# Patient Record
Sex: Female | Born: 1995 | Race: White | Hispanic: No | Marital: Married | Smoking: Current every day smoker
Health system: Southern US, Community
[De-identification: ages and names within clinical notes are randomized; demographics above are authoritative.]

## PROBLEM LIST (undated history)

## (undated) DIAGNOSIS — F1910 Other psychoactive substance abuse, uncomplicated: Secondary | ICD-10-CM

## (undated) HISTORY — PX: WISDOM TOOTH EXTRACTION: SHX21

---
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
## 2016-04-25 ENCOUNTER — Emergency Department (HOSPITAL_COMMUNITY): Payer: Self-pay

## 2016-04-25 ENCOUNTER — Encounter (HOSPITAL_COMMUNITY): Payer: Self-pay | Admitting: Emergency Medicine

## 2016-04-25 ENCOUNTER — Inpatient Hospital Stay (HOSPITAL_COMMUNITY): Payer: Self-pay

## 2016-04-25 ENCOUNTER — Inpatient Hospital Stay (HOSPITAL_COMMUNITY)
Admission: EM | Admit: 2016-04-25 | Discharge: 2016-04-30 | DRG: 917 | Disposition: A | Discharge status: Home / Self Care | Payer: Medicaid Other | Attending: Internal Medicine | Admitting: Internal Medicine

## 2016-04-25 DIAGNOSIS — J9601 Acute respiratory failure with hypoxia: Secondary | ICD-10-CM | POA: Diagnosis not present

## 2016-04-25 DIAGNOSIS — G92 Toxic encephalopathy: Secondary | ICD-10-CM | POA: Diagnosis present

## 2016-04-25 DIAGNOSIS — R739 Hyperglycemia, unspecified: Secondary | ICD-10-CM | POA: Diagnosis present

## 2016-04-25 DIAGNOSIS — Z4659 Encounter for fitting and adjustment of other gastrointestinal appliance and device: Secondary | ICD-10-CM

## 2016-04-25 DIAGNOSIS — N179 Acute kidney failure, unspecified: Secondary | ICD-10-CM | POA: Diagnosis present

## 2016-04-25 DIAGNOSIS — F1290 Cannabis use, unspecified, uncomplicated: Secondary | ICD-10-CM | POA: Diagnosis present

## 2016-04-25 DIAGNOSIS — Z452 Encounter for adjustment and management of vascular access device: Secondary | ICD-10-CM

## 2016-04-25 DIAGNOSIS — G4700 Insomnia, unspecified: Secondary | ICD-10-CM | POA: Diagnosis present

## 2016-04-25 DIAGNOSIS — F419 Anxiety disorder, unspecified: Secondary | ICD-10-CM | POA: Diagnosis present

## 2016-04-25 DIAGNOSIS — J810 Acute pulmonary edema: Secondary | ICD-10-CM

## 2016-04-25 DIAGNOSIS — T401X1A Poisoning by heroin, accidental (unintentional), initial encounter: Secondary | ICD-10-CM | POA: Diagnosis not present

## 2016-04-25 DIAGNOSIS — J9690 Respiratory failure, unspecified, unspecified whether with hypoxia or hypercapnia: Secondary | ICD-10-CM

## 2016-04-25 DIAGNOSIS — Z888 Allergy status to other drugs, medicaments and biological substances status: Secondary | ICD-10-CM

## 2016-04-25 DIAGNOSIS — A419 Sepsis, unspecified organism: Secondary | ICD-10-CM | POA: Diagnosis present

## 2016-04-25 DIAGNOSIS — J189 Pneumonia, unspecified organism: Secondary | ICD-10-CM

## 2016-04-25 DIAGNOSIS — J9600 Acute respiratory failure, unspecified whether with hypoxia or hypercapnia: Secondary | ICD-10-CM | POA: Diagnosis present

## 2016-04-25 DIAGNOSIS — F17210 Nicotine dependence, cigarettes, uncomplicated: Secondary | ICD-10-CM | POA: Diagnosis present

## 2016-04-25 DIAGNOSIS — B951 Streptococcus, group B, as the cause of diseases classified elsewhere: Secondary | ICD-10-CM | POA: Diagnosis present

## 2016-04-25 DIAGNOSIS — N390 Urinary tract infection, site not specified: Secondary | ICD-10-CM | POA: Diagnosis present

## 2016-04-25 DIAGNOSIS — R6521 Severe sepsis with septic shock: Secondary | ICD-10-CM | POA: Diagnosis present

## 2016-04-25 DIAGNOSIS — T68XXXA Hypothermia, initial encounter: Secondary | ICD-10-CM | POA: Diagnosis present

## 2016-04-25 DIAGNOSIS — J690 Pneumonitis due to inhalation of food and vomit: Secondary | ICD-10-CM | POA: Diagnosis present

## 2016-04-25 DIAGNOSIS — E876 Hypokalemia: Secondary | ICD-10-CM | POA: Diagnosis not present

## 2016-04-25 DIAGNOSIS — E872 Acidosis: Secondary | ICD-10-CM | POA: Diagnosis present

## 2016-04-25 HISTORY — DX: Other psychoactive substance abuse, uncomplicated: F19.10

## 2016-04-25 LAB — GLUCOSE, CAPILLARY
Glucose-Capillary: 39 mg/dL — CL (ref 65–99)
Glucose-Capillary: 107 mg/dL — ABNORMAL HIGH (ref 65–99)

## 2016-04-25 LAB — COMPREHENSIVE METABOLIC PANEL
ALBUMIN: 4.5 g/dL (ref 3.5–5.0)
ALBUMIN: 3.4 g/dL — AB (ref 3.5–5.0)
ALK PHOS: 127 U/L — AB (ref 38–126)
ALK PHOS: 68 U/L (ref 38–126)
ALT: 17 U/L (ref 14–54)
ALT: 17 U/L (ref 14–54)
AST: 52 U/L — ABNORMAL HIGH (ref 15–41)
AST: 55 U/L — AB (ref 15–41)
Anion gap: 20 — ABNORMAL HIGH (ref 5–15)
Anion gap: 4 — ABNORMAL LOW (ref 5–15)
BILIRUBIN TOTAL: 1 mg/dL (ref 0.3–1.2)
BUN: 17 mg/dL (ref 6–20)
BUN: 21 mg/dL — AB (ref 6–20)
CALCIUM: 9 mg/dL (ref 8.9–10.3)
CALCIUM: 7.5 mg/dL — AB (ref 8.9–10.3)
CHLORIDE: 104 mmol/L (ref 101–111)
CO2: 16 mmol/L — ABNORMAL LOW (ref 22–32)
CO2: 24 mmol/L (ref 22–32)
CREATININE: 1.14 mg/dL — AB (ref 0.44–1.00)
Chloride: 113 mmol/L — ABNORMAL HIGH (ref 101–111)
Creatinine, Ser: 0.74 mg/dL (ref 0.44–1.00)
GFR calc Af Amer: >60 mL/min (ref >60)
GFR calc Af Amer: >60 mL/min (ref >60)
GFR calc non Af Amer: >60 mL/min (ref >60)
GLUCOSE: 47 mg/dL — AB (ref 65–99)
Glucose, Bld: 223 mg/dL — ABNORMAL HIGH (ref 65–99)
POTASSIUM: 3.3 mmol/L — AB (ref 3.5–5.1)
Potassium: 5.1 mmol/L (ref 3.5–5.1)
SODIUM: 140 mmol/L (ref 135–145)
Sodium: 141 mmol/L (ref 135–145)
TOTAL PROTEIN: 7.6 g/dL (ref 6.5–8.1)
Total Bilirubin: 0.7 mg/dL (ref 0.3–1.2)
Total Protein: 5.5 g/dL — ABNORMAL LOW (ref 6.5–8.1)

## 2016-04-25 LAB — RAPID URINE DRUG SCREEN, HOSP PERFORMED
Amphetamines: NOT DETECTED
BARBITURATES: NOT DETECTED
Benzodiazepines: POSITIVE — AB
Cocaine: POSITIVE — AB
Opiates: NOT DETECTED
Tetrahydrocannabinol: POSITIVE — AB

## 2016-04-25 LAB — CBC WITH DIFFERENTIAL/PLATELET
BASOS PCT: 0 %
Basophils Absolute: 0 10*3/uL (ref 0.0–0.1)
EOS ABS: 0 10*3/uL (ref 0.0–0.7)
EOS PCT: 0 %
HCT: 45.3 % (ref 36.0–46.0)
Hemoglobin: 15 g/dL (ref 12.0–15.0)
Lymphocytes Relative: 18 %
Lymphs Abs: 3.6 10*3/uL (ref 0.7–4.0)
MCH: 31.6 pg (ref 26.0–34.0)
MCHC: 33.1 g/dL (ref 30.0–36.0)
MCV: 95.6 fL (ref 78.0–100.0)
Monocytes Absolute: 0.1 10*3/uL (ref 0.1–1.0)
Monocytes Relative: 1 %
Neutro Abs: 16 10*3/uL — ABNORMAL HIGH (ref 1.7–7.7)
Neutrophils Relative %: 81 %
PLATELETS: 343 10*3/uL (ref 150–400)
RBC: 4.74 MIL/uL (ref 3.87–5.11)
RDW: 12.6 % (ref 11.5–15.5)
WBC: 19.9 10*3/uL — AB (ref 4.0–10.5)

## 2016-04-25 LAB — TYPE AND SCREEN
ABO/RH(D): O POS
Antibody Screen: NEGATIVE

## 2016-04-25 LAB — URINALYSIS, ROUTINE W REFLEX MICROSCOPIC
BILIRUBIN URINE: NEGATIVE
Glucose, UA: >=500 mg/dL — AB
Ketones, ur: 5 mg/dL — AB
Leukocytes, UA: NEGATIVE
Nitrite: NEGATIVE
PH: 5 (ref 5.0–8.0)
Protein, ur: 100 mg/dL — AB
SPECIFIC GRAVITY, URINE: 1.013 (ref 1.005–1.030)

## 2016-04-25 LAB — CG4 I-STAT (LACTIC ACID): Lactic Acid, Venous: 2.78 mmol/L (ref 0.5–1.9)

## 2016-04-25 LAB — I-STAT CG4 LACTIC ACID, ED: Lactic Acid, Venous: 15.7 mmol/L (ref 0.5–1.9)

## 2016-04-25 LAB — LACTIC ACID, PLASMA
Lactic Acid, Venous: 2 mmol/L (ref 0.5–1.9)
Lactic Acid, Venous: 2.8 mmol/L (ref 0.5–1.9)

## 2016-04-25 LAB — PHOSPHORUS: Phosphorus: 2.9 mg/dL (ref 2.5–4.6)

## 2016-04-25 LAB — ABO/RH: ABO/RH(D): O POS

## 2016-04-25 LAB — MRSA PCR SCREENING: MRSA by PCR: NEGATIVE

## 2016-04-25 LAB — PROTIME-INR
INR: 1.35
PROTHROMBIN TIME: 16.8 s — AB (ref 11.4–15.2)

## 2016-04-25 LAB — ETHANOL

## 2016-04-25 LAB — APTT: APTT: 25 s (ref 24–36)

## 2016-04-25 LAB — CORTISOL: Cortisol, Plasma: 46.1 ug/dL

## 2016-04-25 LAB — TROPONIN I: TROPONIN I: 0.03 ng/mL — AB (ref <0.03)

## 2016-04-25 LAB — PREGNANCY, URINE: Preg Test, Ur: NEGATIVE

## 2016-04-25 LAB — MAGNESIUM: MAGNESIUM: 1.7 mg/dL (ref 1.7–2.4)

## 2016-04-25 LAB — PROCALCITONIN: Procalcitonin: 4.35 ng/mL

## 2016-04-25 MED ORDER — PIPERACILLIN-TAZOBACTAM 3.375 G IVPB 30 MIN
3.3750 g | Freq: Once | INTRAVENOUS | Status: AC
  Administered 2016-04-25: 3.375 g via INTRAVENOUS
  Filled 2016-04-25: qty 50

## 2016-04-25 MED ORDER — LORAZEPAM 2 MG/ML IJ SOLN
1.0000 mg | Freq: Once | INTRAMUSCULAR | Status: AC
  Administered 2016-04-25: 1 mg via INTRAMUSCULAR
  Filled 2016-04-25: qty 1

## 2016-04-25 MED ORDER — POTASSIUM CHLORIDE 10 MEQ/100ML IV SOLN
10.0000 meq | INTRAVENOUS | Status: AC
  Administered 2016-04-25 (×3): 10 meq via INTRAVENOUS
  Filled 2016-04-25 (×3): qty 100

## 2016-04-25 MED ORDER — FAMOTIDINE IN NACL 20-0.9 MG/50ML-% IV SOLN
20.0000 mg | INTRAVENOUS | Status: DC
Start: 2016-04-25 — End: 2016-04-26
  Administered 2016-04-25: 20 mg via INTRAVENOUS
  Filled 2016-04-25: qty 50

## 2016-04-25 MED ORDER — DEXTROSE 50 % IV SOLN
INTRAVENOUS | Status: AC
  Administered 2016-04-25: 25 mL
  Filled 2016-04-25: qty 50

## 2016-04-25 MED ORDER — SODIUM CHLORIDE 0.9 % IV SOLN
250.0000 mL | INTRAVENOUS | Status: DC | PRN
  Administered 2016-04-25: 20 mL via INTRAVENOUS

## 2016-04-25 MED ORDER — MIDAZOLAM HCL 2 MG/2ML IJ SOLN
2.0000 mg | INTRAMUSCULAR | Status: DC | PRN
  Administered 2016-04-25: 2 mg via INTRAVENOUS
  Filled 2016-04-25: qty 2

## 2016-04-25 MED ORDER — ROCURONIUM BROMIDE 50 MG/5ML IV SOLN
70.0000 mg | Freq: Once | INTRAVENOUS | Status: AC
  Administered 2016-04-25: 70 mg via INTRAVENOUS

## 2016-04-25 MED ORDER — MIDAZOLAM HCL 2 MG/2ML IJ SOLN: 2.0000 mg | INTRAMUSCULAR | Status: DC | PRN

## 2016-04-25 MED ORDER — DEXTROSE 50 % IV SOLN
25.0000 mL | Freq: Once | INTRAVENOUS | Status: AC
  Administered 2016-04-25: 25 mL via INTRAVENOUS

## 2016-04-25 MED ORDER — PIPERACILLIN-TAZOBACTAM 3.375 G IVPB
3.3750 g | Freq: Three times a day (TID) | INTRAVENOUS | Status: DC
  Administered 2016-04-25 – 2016-04-30 (×14): 3.375 g via INTRAVENOUS
  Filled 2016-04-25 (×17): qty 50

## 2016-04-25 MED ORDER — VANCOMYCIN HCL IN DEXTROSE 1-5 GM/200ML-% IV SOLN
1000.0000 mg | Freq: Once | INTRAVENOUS | Status: AC
  Administered 2016-04-25: 1000 mg via INTRAVENOUS
  Filled 2016-04-25: qty 200

## 2016-04-25 MED ORDER — VANCOMYCIN HCL 500 MG IV SOLR
500.0000 mg | Freq: Three times a day (TID) | INTRAVENOUS | Status: DC
  Administered 2016-04-25 – 2016-04-27 (×5): 500 mg via INTRAVENOUS
  Filled 2016-04-25 (×6): qty 500

## 2016-04-25 MED ORDER — CHLORHEXIDINE GLUCONATE 0.12% ORAL RINSE (MEDLINE KIT)
15.0000 mL | Freq: Two times a day (BID) | OROMUCOSAL | Status: DC
  Administered 2016-04-25 – 2016-04-26 (×2): 15 mL via OROMUCOSAL

## 2016-04-25 MED ORDER — HEPARIN SODIUM (PORCINE) 5000 UNIT/ML IJ SOLN
5000.0000 [IU] | Freq: Three times a day (TID) | INTRAMUSCULAR | Status: DC
  Administered 2016-04-25 – 2016-04-27 (×6): 5000 [IU] via SUBCUTANEOUS
  Filled 2016-04-25 (×6): qty 1

## 2016-04-25 MED ORDER — PIPERACILLIN-TAZOBACTAM 3.375 G IVPB 30 MIN: 3.3750 g | Freq: Once | INTRAVENOUS | Status: DC

## 2016-04-25 MED ORDER — FENTANYL BOLUS VIA INFUSION
50.0000 ug | INTRAVENOUS | Status: DC | PRN
Start: 2016-04-25 — End: 2016-04-26
  Administered 2016-04-25 – 2016-04-26 (×2): 50 ug via INTRAVENOUS
  Filled 2016-04-25: qty 50

## 2016-04-25 MED ORDER — MIDAZOLAM HCL 2 MG/2ML IJ SOLN
INTRAMUSCULAR | Status: AC
  Administered 2016-04-25: 2 mg
  Filled 2016-04-25: qty 2

## 2016-04-25 MED ORDER — SODIUM CHLORIDE 0.9 % IV SOLN
25.0000 ug/h | INTRAVENOUS | Status: DC
  Administered 2016-04-25: 50 ug/h via INTRAVENOUS
  Administered 2016-04-26: 300 ug/h via INTRAVENOUS
  Filled 2016-04-25 (×2): qty 50

## 2016-04-25 MED ORDER — VANCOMYCIN HCL IN DEXTROSE 1-5 GM/200ML-% IV SOLN: 1000.0000 mg | Freq: Once | INTRAVENOUS | Status: DC

## 2016-04-25 MED ORDER — FENTANYL CITRATE (PF) 100 MCG/2ML IJ SOLN
100.0000 ug | INTRAMUSCULAR | Status: DC | PRN
  Administered 2016-04-25: 100 ug via INTRAVENOUS
  Filled 2016-04-25: qty 2

## 2016-04-25 MED ORDER — FUROSEMIDE 10 MG/ML IJ SOLN
20.0000 mg | Freq: Two times a day (BID) | INTRAMUSCULAR | Status: DC
  Filled 2016-04-25: qty 2

## 2016-04-25 MED ORDER — FUROSEMIDE 10 MG/ML IJ SOLN
INTRAMUSCULAR | Status: AC
  Filled 2016-04-25: qty 4

## 2016-04-25 MED ORDER — VANCOMYCIN HCL IN DEXTROSE 750-5 MG/150ML-% IV SOLN: 750.0000 mg | Freq: Two times a day (BID) | INTRAVENOUS | Status: DC

## 2016-04-25 MED ORDER — MIDAZOLAM HCL 2 MG/2ML IJ SOLN
INTRAMUSCULAR | Status: AC
  Filled 2016-04-25: qty 2

## 2016-04-25 MED ORDER — FENTANYL CITRATE (PF) 100 MCG/2ML IJ SOLN: 100.0000 ug | INTRAMUSCULAR | Status: DC | PRN

## 2016-04-25 MED ORDER — SODIUM CHLORIDE 0.9 % IV BOLUS (SEPSIS)
1000.0000 mL | Freq: Once | INTRAVENOUS | Status: AC
  Administered 2016-04-25: 1000 mL via INTRAVENOUS

## 2016-04-25 MED ORDER — FENTANYL CITRATE (PF) 100 MCG/2ML IJ SOLN
INTRAMUSCULAR | Status: AC
  Administered 2016-04-25: 50 ug
  Filled 2016-04-25: qty 2

## 2016-04-25 MED ORDER — NALOXONE HCL 0.4 MG/ML IJ SOLN: 0.4000 mg | INTRAMUSCULAR | Status: DC | PRN

## 2016-04-25 MED ORDER — SODIUM CHLORIDE 0.9 % IV SOLN: 30.0000 meq | Freq: Once | INTRAVENOUS | Status: DC

## 2016-04-25 MED ORDER — NALOXONE HCL 0.4 MG/ML IJ SOLN
INTRAMUSCULAR | Status: AC
  Administered 2016-04-25: 0.4 mg
  Filled 2016-04-25: qty 1

## 2016-04-25 MED ORDER — ETOMIDATE 2 MG/ML IV SOLN
20.0000 mg | Freq: Once | INTRAVENOUS | Status: AC
  Administered 2016-04-25: 20 mg via INTRAVENOUS

## 2016-04-25 MED ORDER — PHENYLEPHRINE HCL 10 MG/ML IJ SOLN
0.0000 ug/min | INTRAMUSCULAR | Status: DC
  Administered 2016-04-25: 45 ug/min via INTRAVENOUS
  Administered 2016-04-25: 30 ug/min via INTRAVENOUS
  Administered 2016-04-26: 110 ug/min via INTRAVENOUS
  Administered 2016-04-26: 100 ug/min via INTRAVENOUS
  Filled 2016-04-25 (×6): qty 1

## 2016-04-25 MED ORDER — FENTANYL CITRATE (PF) 100 MCG/2ML IJ SOLN: 50.0000 ug | Freq: Once | INTRAMUSCULAR | Status: DC

## 2016-04-25 MED ORDER — ORAL CARE MOUTH RINSE
15.0000 mL | Freq: Four times a day (QID) | OROMUCOSAL | Status: DC
  Administered 2016-04-26 (×3): 15 mL via OROMUCOSAL

## 2016-04-25 MED ORDER — MIDAZOLAM BOLUS VIA INFUSION
1.0000 mg | INTRAVENOUS | Status: DC | PRN
  Administered 2016-04-26: 2 mg via INTRAVENOUS
  Filled 2016-04-25: qty 2

## 2016-04-25 MED ORDER — FUROSEMIDE 10 MG/ML IJ SOLN
10.0000 mg | Freq: Two times a day (BID) | INTRAMUSCULAR | Status: DC
  Administered 2016-04-25: 10 mg via INTRAVENOUS

## 2016-04-25 MED ORDER — SODIUM CHLORIDE 0.9 % IV SOLN
0.0000 mg/h | INTRAVENOUS | Status: DC
  Administered 2016-04-25: 2 mg/h via INTRAVENOUS
  Administered 2016-04-26: 5 mg/h via INTRAVENOUS
  Filled 2016-04-25 (×2): qty 10

## 2016-04-25 MED ORDER — DEXTROSE-NACL 5-0.9 % IV SOLN
INTRAVENOUS | Status: DC
  Administered 2016-04-25: 20:00:00 via INTRAVENOUS

## 2016-04-25 MED ORDER — LACTATED RINGERS IV SOLN: INTRAVENOUS | Status: DC

## 2016-04-25 NOTE — ED Notes (Signed)
Sitter at bedside.

## 2016-04-25 NOTE — ED Triage Notes (Signed)
Per EMS, patient found on the ground outside of a halfway house, given  Narcan IN by a bystander after she was found snorting heroin. Patient screaming and crying.

## 2016-04-25 NOTE — Progress Notes (Signed)
eLink Physician-Brief Progress Note Patient Name: Suzanne Hill DOB: 1995-10-16 MRN: [id number]   Date of Service  04/25/2016  HPI/Events of Note  Hypoxia Awake Alert pcxr edema vs asp heroic OD  eICU Interventions  Lasix kvo abg Adding 6 liters under 100% May need NIMV narcan pccm doc now at bedside     Intervention Category Major Interventions: Airway management  Nelda Bucks. 04/25/2016, 4:20 PM

## 2016-04-25 NOTE — ED Notes (Signed)
Critical care at bedside  

## 2016-04-25 NOTE — H&P (Signed)
PULMONARY / CRITICAL CARE MEDICINE   Name: Birdena Kingma MRN: [id number] DOB: 06-15-1995    ADMISSION DATE:  04/25/2016 CONSULTATION DATE:  4/10  REFERRING MD:  Effie Shy   CHIEF COMPLAINT:  Overdose, aspiration and sepsis   HISTORY OF PRESENT ILLNESS:   This is a 21 year old female w/out sig medical history. Uses marijuana for self medication for insomnia and anxiety. Lives w/ parents. Smokes cigarettes. Works part time as a Designer, fashion/clothing person.  Was found unresponsive outside a half way house on 4/10. Was administered intra-nasal narcan bye bystander & EMS called. On arrival to ER was combative and agitated. Hypoxic w/ sats in 80s on Non-rebreather mask w/ lactic acid 15.7 and SBP in 80s-90s. CXR w/ bilateral airspace disease. 100ml/kg bolus was initiated.  ABX started. Bye time of PCCM arrival pt was awake. Alert and able to recall that she vomited shortly after snorting a line of Heroin and then she "must have passed out".  Her- Repeat Assessment Performed at:1544 Vitals Blood pressure (!) 96/58, pulse (!) 127, temperature (!) 92.7 F (33.7 C), temperature source Rectal, resp. rate 20, weight 120 lb (54.4 kg), SpO2 (!) 89 %. Heart: TachycardicLungs:RalesCapillary Refill:<2 secPeripheral Pulse:Radial pulse palpable Skin:Flushed  She will be admitted to the ICU for further care of aspiration PNA/Acute Hypoxic resp failure and septic shock/   PAST MEDICAL HISTORY :  She  has a past medical history of Substance abuse.  PAST SURGICAL HISTORY: She  has a past surgical history that includes Wisdom tooth extraction.  Allergies  Allergen Reactions  . Benadryl [Diphenhydramine Hcl] Hives    No current facility-administered medications on file prior to encounter.    No current outpatient prescriptions on file prior to encounter.    FAMILY HISTORY:  Her has no family status information on file.    SOCIAL HISTORY: She  reports that she has been smoking.  She has never used smokeless  tobacco. She reports that she drinks alcohol. She reports that she uses drugs.  Review of Systems:   Bolds are positive  Constitutional: weight loss, gain, night sweats, Fevers, chills, fatigue .  HEENT: headaches, Sore throat, sneezing, nasal congestion, post nasal drip, Difficulty swallowing, Tooth/dental problems, visual complaints visual changes, ear ache, lips dry CV:  chest pain, radiates:,Orthopnea, PND, swelling in lower extremitie, dizziness, palpitations, syncope.  GI  heartburn, indigestion, abdominal pain, nausea, vomiting, diarrhea, change in bowel habits, loss of appetite, bloody stools.  Resp:  cough, not productive: , hemoptysis, dyspnea at rest, chest pain, pleuritic.  Skin: rash or itching or icterus GU: dysuria, change in color of urine, urgency or frequency. flank pain, hematuria  MS: joint pain or swelling. decreased range of motion  Psych: change in mood or affect. depression or anxiety.  Neuro: difficulty with speech, weakness, numbness, ataxia   SUBJECTIVE:  Thirsty   VITAL SIGNS: BP (!) 96/58   Pulse (!) 127   Temp (!) 92.7 F (33.7 C) (Rectal)   Resp (!) 21   Wt 120 lb (54.4 kg)   SpO2 (!) 89%   HEMODYNAMICS:    VENTILATOR SETTINGS:    INTAKE / OUTPUT: No intake/output data recorded.  PHYSICAL EXAMINATION: General:  21 year old female awake, alert. No focal def. No acute distress but is short of breath w/ mask off  Neuro:  Awake, oriented no focal def no motor def  HEENT:  [place]. Has small abrasion on tip of forehead  Cardiovascular:  Tachy rrr w/out MRG  Lungs:  Diffuse  rales. No accessory use  Abdomen:  abd soft. Not tender  Musculoskeletal:  Equal st and bulk  Skin:  Warm and dry   LABS:  BMET  Recent Labs Lab 04/25/16 1247  NA 140  K 5.1  CL 104  CO2 16*  BUN 17  CREATININE 1.14*  GLUCOSE 223*    Electrolytes  Recent Labs Lab 04/25/16 1247  CALCIUM 9.0    CBC  Recent Labs Lab 04/25/16 1247  WBC 19.9*  HGB 15.0   HCT 45.3  PLT 343    Coag's No results for input(s): APTT, INR in the last 168 hours.  Sepsis Markers  Recent Labs Lab 04/25/16 1255  LATICACIDVEN 15.70*    ABG No results for input(s): PHART, PCO2ART, PO2ART in the last 168 hours.  Liver Enzymes  Recent Labs Lab 04/25/16 1247  AST 52*  ALT 17  ALKPHOS 127*  BILITOT 1.0  ALBUMIN 4.5    Cardiac Enzymes No results for input(s): TROPONINI, PROBNP in the last 168 hours.  Glucose No results for input(s): GLUCAP in the last 168 hours.  Imaging Dg Chest Port 1 View  Result Date: 04/25/2016 CLINICAL DATA:  Drug overdose EXAM: PORTABLE CHEST 1 VIEW COMPARISON:  PA and lateral chest x-ray of January 29, 2007 FINDINGS: There are fluffy alveolar opacities bilaterally. The pulmonary vascularity is indistinct. The cardiac silhouette is normal in size. There is no pleural effusion or pneumothorax. The mediastinum is normal in width. The bony thorax exhibits no acute abnormality. IMPRESSION: Bilateral pulmonary interstitial and alveolar edema. Bilateral pneumonia is felt less likely. Electronically Signed   By: David  [place] M.D.   On: 04/25/2016 12:42  PCXR R>L patchy pulmonary infiltrates   STUDIES:    CULTURES: UC 4/10>>> BCX2 4/10>>>  ANTIBIOTICS: vanc 4/10>>> Zosyn 4/10>>>  SIGNIFICANT EVENTS:   LINES/TUBES:     ASSESSMENT / PLAN:  Acute Hypoxic respiratory Failure in setting of aspiration PNA Plan High flow oxygen  Cont pulse ox NPO except sips Admit to ICU and monitor for need for intubation  vanc day 0/x Zosyn day 0/x Pct algo    Septic Shock due to aspiration  Plan Complete 30 ml/kg Repeat Lactic acid IVFs abx as above  If lactic acid not cleared > 10% then will need to consider central access   AKI & lactic acidosis Plan IVFs MAP goal > 65 Repeat LA Renal dose meds   Acute encephalopathy. D/t heroin overdose -->resolved currently  Plan Supportive care   Hyperglycemia  Plan Ck  A1C cbg q 4   FAMILY  - Updates:   - Inter-disciplinary family meet or Palliative Care meeting due by: 4/17  DISCUSSION: 21 year old female being admitted w/ acute hypoxic respiratory failure and severe sepsis/septic shock in setting of aspiration after heroin overdose.  -empiric abx -high flow oxygen -complete 18ml/kg bolus -repeat LA and consider more fluids vs central access vs both   My critical care time 39 minutes  Simonne Martinet ACNP-BC [facility] Pulmonary/Critical Care Pager # [phone] OR # [phone] if no answer     04/25/2016, 3:24 PM    ATTENDING NOTE / ATTESTATION NOTE :   I have discussed the case with the resident/APP  Anders Simmonds NP.   I agree with the resident/APP's  history, physical examination, assessment, and plans.    I have edited the above note and modified it according to our agreed history, physical examination, assessment and plan.   21 year old female, no known lung problems, nonsmoker, uses  marijuana to treat insomnia and anxiety, was found unresponsive outside her halfway house. She was given intranasal Narcan and woke up. EMS was called and she was brought to the emergency room. She was combative, tachypneic, hypotensive, tachycardic. Her lactic acid was elevated at 15. Sepsis secondary to aspiration pneumonia was considered. She got 2 L saline at the emergency room. Her O2 saturation was in the mid 80s on room air, improving to the low 90s and high 80s on nonrebreather mask. She was tried on high flow nasal cannula which dropped her sats further. She did not tolerate BiPAP in the emergency room. She remembers vomiting yesterday.  Upon seeing her in the ICU, her respiratory rate was in the 40s. On nonrebreather mask and 6 L nasal cannula, her O2 saturation was low 80s. We ended up intubating her. Please see separate note. Prior to intubation, she wanted me to let her mother know about what's going on. I updated her mother.   Pt had pink frothy  secretions on intubation. Blood pressure dropped  to 60s with sedation, improved with Neo-Synephrine at 50 g per kg per minute. We held further hydration.  Vitals:  Vitals:   04/25/16 1640 04/25/16 1645 04/25/16 1648 04/25/16 1700  BP: (!) 81/55 (!) 66/43 (!) 71/45   Pulse: (!) 130 (!) 160 (!) 154   Resp: (!) 31 20 (!) 24   Temp:      TempSrc:      SpO2: (!) 81% 97% 100% 100%  Weight:      Height:        Constitutional/General: well-nourished, well-developed, intubated, sedated, not in any distress  Body mass index is 19.54 kg/m. Wt Readings from Last 3 Encounters:  04/25/16 54.9 kg (121 lb 0.5 oz)  05/07/13 52.2 kg (115 lb) (30 %, Z= -0.54)*   * Growth percentiles are based on CDC 2-20 Years data.    HEENT: PERLA, anicteric sclerae. (-) Oral thrush. Intubated, ETT in place  Neck: No masses. Midline trachea. No JVD, (-) LAD. (-) bruits appreciated.  Respiratory/Chest: Grossly normal chest. (-) deformity. (-) Accessory muscle use.  Symmetric expansion. Diminished BS on both lower lung zones. (-) wheezing, Crackles bilaterally (-) egophony  Cardiovascular: Regular rate and  rhythm, heart sounds normal, no murmur or gallops,  (-)  peripheral edema  Gastrointestinal:  Normal bowel sounds. Soft, non-tender. No hepatosplenomegaly.  (-) masses.   Musculoskeletal:  Normal muscle tone.   Extremities: Grossly normal. (-) clubbing, cyanosis.  (-) edema  Skin: (-) rash,lesions seen.   Neurological/Psychiatric : sedated, intubated. CN grossly intact. (-) lateralizing signs.     CBC Recent Labs     04/25/16  1247  WBC  19.9*  HGB  15.0  HCT  45.3  PLT  343    Coag's No results for input(s): APTT, INR in the last 72 hours.  BMET Recent Labs     04/25/16  1247  NA  140  K  5.1  CL  104  CO2  16*  BUN  17  CREATININE  1.14*  GLUCOSE  223*    Electrolytes Recent Labs     04/25/16  1247  CALCIUM  9.0    Sepsis Markers No results for input(s):  PROCALCITON, O2SATVEN in the last 72 hours.  Invalid input(s): LACTICACIDVEN  ABG No results for input(s): PHART, PCO2ART, PO2ART in the last 72 hours.  Liver Enzymes Recent Labs     04/25/16  1247  AST  52*  ALT  17  ALKPHOS  127*  BILITOT  1.0  ALBUMIN  4.5    Cardiac Enzymes No results for input(s): TROPONINI, PROBNP in the last 72 hours.  Glucose No results for input(s): GLUCAP in the last 72 hours.  Imaging Dg Chest Port 1 View  Result Date: 04/25/2016 CLINICAL DATA:  Drug overdose EXAM: PORTABLE CHEST 1 VIEW COMPARISON:  PA and lateral chest x-ray of January 29, 2007 FINDINGS: There are fluffy alveolar opacities bilaterally. The pulmonary vascularity is indistinct. The cardiac silhouette is normal in size. There is no pleural effusion or pneumothorax. The mediastinum is normal in width. The bony thorax exhibits no acute abnormality. IMPRESSION: Bilateral pulmonary interstitial and alveolar edema. Bilateral pneumonia is felt less likely. Electronically Signed   By: David  [place] M.D.   On: 04/25/2016 12:42   Assessment/Plan : Acute hypoxemic respiratory failure secondary to diffuse bilateral infiltrates. By history, consistent with aspiration pneumonia with passing out after using marijuana and other drugs. On intubation, she had pink frothy secretions. She had been resuscitated with 2 L saline at the ED. I cannot necessarily say that this is purely ARDS as it might be more of pulmonary edema at this point. - We'll try lung protective strategy. - She is on 100% FiO2, PEEP 12, 400 mL tidal volume, respiratory rate 24. Her O2 saturation is 100%. Plateau is 24 and these settings. Check ABG in an hour. - We will hold off on further hydration. - She will need further diuresis, blood pressure allowing. - Continue Neo-Synephrine to keep map more than 65 mmHg. - Needs sedation. Versed and fentanyl pushes. - Continue broad-spectrum antibiotics with vancomycin and Zosyn. -  Panculture. - EKGs nonspecific but possible ischemia but she was tachycardic. We will trend troponin, check 2-D echo. - Trend lactic acid. - keep NPO for now  Polysubstance abuse - check UDS, ethanol, pregnancy test - versed and fentanyl pushes.   AKI - S/P 2L IVF.  Now with pulm edema. We'll hold off on further hydration. Continue diuresis.  Best practice : Heparin sq for DVT prophylaxis. PPI for SUP   I spent  30  minutes of Critical Care time with this patient today. This is my time spent independent of the APP or resident.   Family :Family updated at length today.  I called up her mother Tresa Endo [phone]. I updated her of her daughter's condition. She seemed estranged but the patient identified her as the next of kin. The patient wanted to keep all her information only to be discussed with her mother.   Pollie Meyer, MD 04/25/2016, 5:15 PM [place] [facility] Pager [phone] After 3 pm or if no answer, call [phone]

## 2016-04-25 NOTE — ED Notes (Signed)
Respiratory at bedside.

## 2016-04-25 NOTE — Progress Notes (Signed)
CRITICAL VALUE ALERT  Critical value received:  Lactic Acid 2.0  Date of notification: 04/25/2016  Time of notification: 1732  Critical value read back: Yes  Nurse who received alert: Hazel Sams RN   MD notified (1st page):  MD Tyson Alias   Time of first page: 1734  MD notified (2nd page):  Time of second page:  Responding MD: MD Tyson Alias   Time MD responded:  [phone]

## 2016-04-25 NOTE — ED Provider Notes (Signed)
[facility]-EMERGENCY DEPT Provider Note   CSN: [id number] Arrival date & time: 04/25/16  1127     History   Chief Complaint Chief Complaint  Patient presents with  . Drug Overdose    HPI Suzanne Hill is a 21 y.o. female.  The patient presents with suspected narcotic overdose, after receiving bystander Narcan intranasal.  She was combative, and crying, during transport.  She did not receive additional treatments, and EMS.  She is unable to give any history.  Level 5 caveat-altered mental status  HPI  Past Medical History:  Diagnosis Date  . Substance abuse     Patient Active Problem List   Diagnosis Date Noted  . Acute respiratory failure (HCC) 04/25/2016  . Chlamydia 05/08/2013  . Abdominal pain 05/08/2013    Past Surgical History:  Procedure Laterality Date  . WISDOM TOOTH EXTRACTION      OB History    No data available       Home Medications    Prior to Admission medications   Not on File    Family History History reviewed. No pertinent family history.  Social History Social History  Substance Use Topics  . Smoking status: Current Every Day Smoker  . Smokeless tobacco: Never Used  . Alcohol use Yes     Allergies   Benadryl [diphenhydramine hcl]   Review of Systems Review of Systems  Unable to perform ROS: Mental status change     Physical Exam Updated Vital Signs BP 99/63   Pulse (!) 135   Temp (!) 92.7 F (33.7 C) (Rectal)   Resp (!) 31   Wt 120 lb (54.4 kg)   SpO2 (!) 87%   Physical Exam  Constitutional: She appears well-developed.  Unkempt  HENT:  Head: Normocephalic.  Blood left forehead and right nares, unclear source.  No contusion or crepitation of the face.  Dry oral mucous membranes  Eyes: Conjunctivae and EOM are normal. Pupils are equal, round, and reactive to light.  Neck: Normal range of motion and phonation normal. Neck supple.  Cardiovascular: Regular rhythm.   Tachycardic  Pulmonary/Chest: Effort normal and  breath sounds normal. She exhibits no tenderness.  Abdominal: Soft. She exhibits no distension. There is no tenderness. There is no guarding.  Musculoskeletal: Normal range of motion.  Neurological: She is alert. She exhibits normal muscle tone.  Alert, minimally cooperative with exam, continually crying out, secondary to discomfort.  Skin: Skin is warm and dry.  Psychiatric:  Agitated, somewhat combative.  Nursing note and vitals reviewed.    ED Treatments / Results  Labs (all labs ordered are listed, but only abnormal results are displayed) Labs Reviewed  COMPREHENSIVE METABOLIC PANEL - Abnormal; Notable for the following:       Result Value   CO2 16 (*)    Glucose, Bld 223 (*)    Creatinine, Ser 1.14 (*)    AST 52 (*)    Alkaline Phosphatase 127 (*)    Anion gap 20 (*)    All other components within normal limits  CBC WITH DIFFERENTIAL/PLATELET - Abnormal; Notable for the following:    WBC 19.9 (*)    Neutro Abs 16.0 (*)    All other components within normal limits  URINALYSIS, ROUTINE W REFLEX MICROSCOPIC - Abnormal; Notable for the following:    APPearance HAZY (*)    Glucose, UA >=500 (*)    Hgb urine dipstick SMALL (*)    Ketones, ur 5 (*)    Protein, ur 100 (*)  Bacteria, UA RARE (*)    Squamous Epithelial / LPF 0-5 (*)    All other components within normal limits  I-STAT CG4 LACTIC ACID, ED - Abnormal; Notable for the following:    Lactic Acid, Venous 15.70 (*)    All other components within normal limits  CULTURE, BLOOD (ROUTINE X 2)  CULTURE, BLOOD (ROUTINE X 2)  URINE CULTURE  COMPREHENSIVE METABOLIC PANEL  LACTIC ACID, PLASMA  LACTIC ACID, PLASMA  CORTISOL  PROTIME-INR  PROCALCITONIN  APTT  HIV ANTIBODY (ROUTINE TESTING)  I-STAT CG4 LACTIC ACID, ED  TYPE AND SCREEN    EKG  EKG Interpretation  Date/Time:  Tuesday April 25 2016 12:27:22 EDT Ventricular Rate:  124 PR Interval:    QRS Duration: 90 QT Interval:  312 QTC Calculation: 449 R  Axis:   80 Text Interpretation:  indeterminate rhythym Repol abnrm suggests ischemia, diffuse leads Artifact in lead(s) I II III aVR aVL aVF V1 V2 V3 V4 V5 V6 No previous ECGs available Confirmed by Effie Shy  MD, Baer Hinton [phone]) on 04/25/2016 1:46:17 PM       Radiology Dg Chest Port 1 View  Result Date: 04/25/2016 CLINICAL DATA:  Drug overdose EXAM: PORTABLE CHEST 1 VIEW COMPARISON:  PA and lateral chest x-ray of January 29, 2007 FINDINGS: There are fluffy alveolar opacities bilaterally. The pulmonary vascularity is indistinct. The cardiac silhouette is normal in size. There is no pleural effusion or pneumothorax. The mediastinum is normal in width. The bony thorax exhibits no acute abnormality. IMPRESSION: Bilateral pulmonary interstitial and alveolar edema. Bilateral pneumonia is felt less likely. Electronically Signed   By: David  [place] M.D.   On: 04/25/2016 12:42    Procedures Procedures (including critical care time)  Medications Ordered in ED Medications  piperacillin-tazobactam (ZOSYN) IVPB 3.375 g (not administered)  vancomycin (VANCOCIN) IVPB 1000 mg/200 mL premix (not administered)  lactated ringers infusion (not administered)  heparin injection 5,000 Units (not administered)  0.9 %  sodium chloride infusion (not administered)  LORazepam (ATIVAN) injection 1 mg (1 mg Intramuscular Given 04/25/16 1215)  sodium chloride 0.9 % bolus 1,000 mL (1,000 mLs Intravenous New Bag/Given 04/25/16 1256)    And  sodium chloride 0.9 % bolus 1,000 mL (1,000 mLs Intravenous New Bag/Given 04/25/16 1256)  piperacillin-tazobactam (ZOSYN) IVPB 3.375 g (0 g Intravenous Stopped 04/25/16 1433)  vancomycin (VANCOCIN) IVPB 1000 mg/200 mL premix (0 mg Intravenous Stopped 04/25/16 1433)     Initial Impression / Assessment and Plan / ED Course  I have reviewed the triage vital signs and the nursing notes.  Pertinent labs & imaging results that were available during my care of the patient were reviewed by me  and considered in my medical decision making (see chart for details).  Clinical Course as of Apr 25 1516  Tue Apr 25, 2016  1310 At this time, on BiPAP, oxygen saturation is normal 99%.  Patient is alert conversant and request fluid to drink.  [EW]  1324 Oxygen stabilization maneuvers: Nasal cannula without improvement, placed on BiPAP, with significant improvement, in respiratory status.  [EW]  1324 Blood pressure assessment, initially hypertensive, repeat blood pressure at 1302 74/52.  Pressure repeated by me, now, is 121/73.  The prior blood pressure appears to be erroneous, it was noted that the blood pressure cuff was on the bed, before I took the blood pressure.  [EW]  1334 Chest x-ray indicates pulmonary edema, patient currently on facemask oxygen at 15 L, her request after pulling off the BiPAP facemask.  Oxygen saturation 90% currently.  Patient continues to [facility] well.  IV fluid boluses turned down to 125, 2 bags.  We will consult intensivist, but likely will minimize IV fluid boluses at this time.  [EW]  1517 Rectal temperature improving.  Persistent tachycardia.  [EW]    Clinical Course User Index [EW] Mancel Bale, MD    Medications  piperacillin-tazobactam (ZOSYN) IVPB 3.375 g (not administered)  vancomycin (VANCOCIN) IVPB 1000 mg/200 mL premix (not administered)  lactated ringers infusion (not administered)  heparin injection 5,000 Units (not administered)  0.9 %  sodium chloride infusion (not administered)  LORazepam (ATIVAN) injection 1 mg (1 mg Intramuscular Given 04/25/16 1215)  sodium chloride 0.9 % bolus 1,000 mL (1,000 mLs Intravenous New Bag/Given 04/25/16 1256)    And  sodium chloride 0.9 % bolus 1,000 mL (1,000 mLs Intravenous New Bag/Given 04/25/16 1256)  piperacillin-tazobactam (ZOSYN) IVPB 3.375 g (0 g Intravenous Stopped 04/25/16 1433)  vancomycin (VANCOCIN) IVPB 1000 mg/200 mL premix (0 mg Intravenous Stopped 04/25/16 1433)    Patient Vitals for the past 24  hrs:  BP Temp Temp src Pulse Resp SpO2 Weight  04/25/16 1445 99/63 - - (!) 135 (!) 31 (!) 87 % -  04/25/16 1416 111/74 (!) 92.7 F (33.7 C) Rectal (!) 135 (!) 26 92 % -  04/25/16 1400 116/74 - - (!) 136 (!) 28 91 % -  04/25/16 1339 - - - - - - 120 lb (54.4 kg)  04/25/16 1330 118/76 - - (!) 142 (!) 36 90 % -  04/25/16 1327 123/71 - - (!) 139 (!) 24 (!) 88 % -  04/25/16 1302 - - - (!) 131 (!) 25 99 % -  04/25/16 1247 (!) 131/109 - - (!) 113 (!) 44 94 % -  04/25/16 1156 (!) 131/109 (!) 85 F (29.4 C) - 84 (!) 24 (!) 84 % -   13: 35-requested called to intensivist, for assistance with admission and management.  Case discussed with intensivist, will minimize fluid boluses, and they will admit the patient.  3:17 PM Reevaluation with update and discussion. After initial assessment and treatment, an updated evaluation reveals she remains alert, and communicative, and is thirsty.Mancel Bale L    CRITICAL CARE Performed by: Flint Melter Total critical care time: 50 minutes Critical care time was exclusive of separately billable procedures and treating other patients. Critical care was necessary to treat or prevent imminent or life-threatening deterioration. Critical care was time spent personally by me on the following activities: development of treatment plan with patient and/or surrogate as well as nursing, discussions with consultants, evaluation of patient's response to treatment, examination of patient, obtaining history from patient or surrogate, ordering and performing treatments and interventions, ordering and review of laboratory studies, ordering and review of radiographic studies, pulse oximetry and re-evaluation of patient's condition.  Final Clinical Impressions(s) / ED Diagnoses   Final diagnoses:  Accidental overdose of heroin, initial encounter  Hypothermia, initial encounter  Acute pulmonary edema (HCC)    Patient with suspected opiate overdose, likely had a period of  hypoventilation, possibly with secondary acute pulmonary edema.  Patient with elevated lactate, indicating hypoperfusion, but there is no suspected infection, etiology.  Elevated white blood cell count is nonspecific.  Empiric antibiotics have been started.  Blood and urine cultures are pending.  Patient hypothermic, being treated with warming blanket.   Nursing Notes Reviewed/ Care Coordinated Applicable Imaging Reviewed Interpretation of Laboratory Data incorporated into ED treatment   Plan : Admit  New Prescriptions New Prescriptions   No medications on file     Mancel Bale, MD 04/26/16 [phone]

## 2016-04-25 NOTE — Progress Notes (Signed)
eLink Physician-Brief Progress Note Patient Name: Suzanne Hill DOB: Sep 12, 1995 MRN: [id number]   Date of Service  04/25/2016  HPI/Events of Note  pepcid  eICU Interventions       Intervention Category Intermediate Interventions: Best-practice therapies (e.g. DVT, beta blocker, etc.)  Nelda Bucks. 04/25/2016, 6:00 PM

## 2016-04-25 NOTE — ED Notes (Signed)
MD made aware of temperature and trending O2 levels.

## 2016-04-25 NOTE — ED Notes (Signed)
Bed: WA20 Expected date:  Expected time:  Means of arrival:  Comments: EMS/heroin OD 

## 2016-04-25 NOTE — Progress Notes (Signed)
eLink Physician-Brief Progress Note Patient Name: Suzanne Hill DOB: July 20, 1995 MRN: [id number]   Date of Service  04/25/2016  HPI/Events of Note  ARDS? High peep Agitation increasing  eICU Interventions  Add versed int fent drip ( heroic OD , now showing WD)     Intervention Category Major Interventions: OtherNelda Bucks. 04/25/2016, 6:45 PM

## 2016-04-25 NOTE — Procedures (Signed)
Intubation Procedure Note Suzanne Hill [id number] [birth year]  Procedure: Intubation Indications: Respiratory insufficiency   Pt with worsening resp status since arrival to ICU.  o2 sats 80% on 100% NRBM and 6L [place]. RR 40s.   Procedure Details Consent: Risks of procedure as well as the alternatives and risks of each were explained to the (patient/caregiver).  Consent for procedure obtained. Time Out: Verified patient identification, verified procedure, site/side was marked, verified correct patient position, special equipment/implants available, medications/allergies/relevent history reviewed, required imaging and test results available.  Performed  Maximum sterile technique was used including antiseptics, hand hygiene and mask.  MAC and 3  Versed 2 mg, Fentanyl 50 mg, Etomidate 20 mg, Rocuronium 70 mg >>> all IV and divided doses.  Pt had pink frothy secretions with intubation.     Evaluation Hemodynamic Status: Transient hypotension treated with pressors; O2 sats: stable throughout Patient's Current Condition: stable Complications: No apparent complications Patient did tolerate procedure well. Chest X-ray ordered to verify placement.  CXR: pending.   [place] 04/25/2016

## 2016-04-25 NOTE — Progress Notes (Signed)
eLink Physician-Brief Progress Note Patient Name: Suzanne Hill DOB: Aug 05, 1995 MRN: [id number]   Date of Service  04/25/2016  HPI/Events of Note  Asp vs edema from heroin  OD Awake alert,  On 100% NRB See my prior note for managemwent Lactic cleraring well  eICU Interventions  abg May need ett PCCm md assessing for ett need Lasix to neg balance Narcan, may need drip     Intervention Category Evaluation Type: New Patient Evaluation  Suzanne Hill. 04/25/2016, 4:38 PM

## 2016-04-25 NOTE — Progress Notes (Signed)
Notified MD Tyson Alias in regards to patient's CBG and also increase agitation. Told patient is in bilateral wrist restraints due to patient actively trying to self extubated. Shelby RN at bedside.

## 2016-04-25 NOTE — ED Notes (Signed)
ED Provider at bedside. 

## 2016-04-25 NOTE — ED Notes (Signed)
XR at bedside

## 2016-04-25 NOTE — Progress Notes (Signed)
Pharmacy Antibiotic Note  Suzanne Hill is a 21 y.o. female admitted on 04/25/2016 with heroin OD and septic shock due to aspiration. Pharmacy has been consulted for Vancomycin and Zosyn dosing.  Plan: Vancomycin 1g IV x 1 given in the ED. Continue with Vancomycin  IV q8h. Plan for Vancomycin trough level at steady state. Goal trough level 15-20 mcg/mL. Zosyn 3.375g IV x 1 over 30 minutes given in the ED. Continue with Zosyn 3.375g IV q8h (infuse over 4 hours). Monitor renal function, cultures, clinical course.  Height:  (167.6 cm) Weight: 121 lb 0.5 oz (54.9 kg) IBW/kg (Calculated) : 59.3  Temp (24hrs), Avg:92.9 F (33.8 C), Min:85 F (29.4 C), Max:97.5 F (36.4 C)   Recent Labs Lab 04/25/16 1247 04/25/16 1255 04/25/16 1554 04/25/16 1629  WBC 19.9*  --   --   --   CREATININE 1.14*  --   --  0.74  LATICACIDVEN  --  15.70* 2.78* 2.0*    Estimated Creatinine Clearance: 96.4 mL/min (by C-G formula based on SCr of 0.74 mg/dL).    Allergies  Allergen Reactions  . Benadryl [Diphenhydramine Hcl] Hives    Antimicrobials this admission: 4/10 >> Vancomycin >> 4/10 >> Zosyn >>  Dose adjustments this admission: --   Microbiology results: 4/10 BCx: sent 4/10 UCx: sent  4/10 Sputum: sent 4/10 MRSA PCR: sent  Thank you for allowing pharmacy to be a part of this patient's care.    Greer Pickerel, PharmD, BCPS Pager: [phone] 04/25/2016 4:45 PM

## 2016-04-25 NOTE — ED Notes (Signed)
Respiratory called for Bipap

## 2016-04-25 NOTE — ED Notes (Signed)
Pt's rectal temp=92.7

## 2016-04-25 NOTE — Progress Notes (Signed)
eLink Physician-Brief Progress Note Patient Name: Taytum Scheck DOB: 1995/04/23 MRN: [id number]   Date of Service  04/25/2016  HPI/Events of Note  Still needs restraints n now sig sedation  eICU Interventions       Intervention Category Minor Interventions: Routine modifications to care plan (e.g. PRN medications for pain, fever)  Nelda Bucks. 04/25/2016, 8:19 PM

## 2016-04-25 NOTE — Progress Notes (Signed)
eLink Physician-Brief Progress Note Patient Name: Suzanne Hill DOB: 01/27/1995 MRN: [id number]   Date of Service  04/25/2016  HPI/Events of Note  Not at goal rass ards Start versed drip Low glu  addd 5  eICU Interventions       Intervention Category Major Interventions: OtherNelda Bucks. 04/25/2016, 7:27 PM

## 2016-04-26 ENCOUNTER — Inpatient Hospital Stay (HOSPITAL_COMMUNITY): Payer: Self-pay

## 2016-04-26 DIAGNOSIS — T40601S Poisoning by unspecified narcotics, accidental (unintentional), sequela: Secondary | ICD-10-CM | POA: Diagnosis not present

## 2016-04-26 DIAGNOSIS — R579 Shock, unspecified: Secondary | ICD-10-CM

## 2016-04-26 DIAGNOSIS — J9601 Acute respiratory failure with hypoxia: Secondary | ICD-10-CM | POA: Diagnosis not present

## 2016-04-26 DIAGNOSIS — R0600 Dyspnea, unspecified: Secondary | ICD-10-CM

## 2016-04-26 LAB — BASIC METABOLIC PANEL
Anion gap: 4 — ABNORMAL LOW (ref 5–15)
BUN: 16 mg/dL (ref 6–20)
CALCIUM: 7.1 mg/dL — AB (ref 8.9–10.3)
CO2: 22 mmol/L (ref 22–32)
CREATININE: 0.77 mg/dL (ref 0.44–1.00)
Chloride: 114 mmol/L — ABNORMAL HIGH (ref 101–111)
GFR calc Af Amer: >60 mL/min (ref >60)
GFR calc non Af Amer: >60 mL/min (ref >60)
GLUCOSE: 75 mg/dL (ref 65–99)
POTASSIUM: 3.7 mmol/L (ref 3.5–5.1)
SODIUM: 140 mmol/L (ref 135–145)

## 2016-04-26 LAB — CBC
HCT: 29.1 % — ABNORMAL LOW (ref 36.0–46.0)
Hemoglobin: 10 g/dL — ABNORMAL LOW (ref 12.0–15.0)
MCH: 32.2 pg (ref 26.0–34.0)
MCHC: 34.4 g/dL (ref 30.0–36.0)
MCV: 93.6 fL (ref 78.0–100.0)
PLATELETS: 183 10*3/uL (ref 150–400)
RBC: 3.11 MIL/uL — AB (ref 3.87–5.11)
RDW: 12.7 % (ref 11.5–15.5)
WBC: 21.3 10*3/uL — ABNORMAL HIGH (ref 4.0–10.5)

## 2016-04-26 LAB — MAGNESIUM: Magnesium: 1.2 mg/dL — ABNORMAL LOW (ref 1.7–2.4)

## 2016-04-26 LAB — URINE CULTURE: Culture: >=100000 — AB

## 2016-04-26 LAB — ECHOCARDIOGRAM COMPLETE
Height: 66 in
Weight: 1851.86 oz

## 2016-04-26 LAB — BLOOD GAS, ARTERIAL
ACID-BASE DEFICIT: 4.6 mmol/L — AB (ref 0.0–2.0)
Bicarbonate: 20.1 mmol/L (ref 20.0–28.0)
Drawn by: 225631
FIO2: 40
LHR: 24 {breaths}/min
MECHVT: 0.4 mL
O2 SAT: 98 %
PEEP/CPAP: 10 cmH2O
PO2 ART: 114 mmHg — AB (ref 83.0–108.0)
Patient temperature: 100.1
pCO2 arterial: 39.5 mmHg (ref 32.0–48.0)
pH, Arterial: 7.332 — ABNORMAL LOW (ref 7.350–7.450)

## 2016-04-26 LAB — TROPONIN I
Troponin I: 0.03 ng/mL (ref <0.03)
Troponin I: 0.04 ng/mL (ref <0.03)

## 2016-04-26 LAB — HIV ANTIBODY (ROUTINE TESTING W REFLEX): HIV SCREEN 4TH GENERATION: NONREACTIVE

## 2016-04-26 LAB — PHOSPHORUS: Phosphorus: 2.9 mg/dL (ref 2.5–4.6)

## 2016-04-26 LAB — PROCALCITONIN: Procalcitonin: 9.78 ng/mL

## 2016-04-26 MED ORDER — DEXAMETHASONE SODIUM PHOSPHATE 10 MG/ML IJ SOLN
10.0000 mg | Freq: Once | INTRAMUSCULAR | Status: AC
  Administered 2016-04-26: 10 mg via INTRAVENOUS

## 2016-04-26 MED ORDER — ORAL CARE MOUTH RINSE
15.0000 mL | Freq: Two times a day (BID) | OROMUCOSAL | Status: DC
  Administered 2016-04-26 – 2016-04-30 (×3): 15 mL via OROMUCOSAL

## 2016-04-26 MED ORDER — ACETAMINOPHEN 325 MG PO TABS
650.0000 mg | ORAL_TABLET | ORAL | Status: DC | PRN
  Administered 2016-04-27: 650 mg via ORAL
  Filled 2016-04-26: qty 2

## 2016-04-26 MED ORDER — SODIUM CHLORIDE 0.9% FLUSH: 10.0000 mL | INTRAVENOUS | Status: DC | PRN

## 2016-04-26 MED ORDER — SODIUM CHLORIDE 0.9 % IV BOLUS (SEPSIS): 1000.0000 mL | Freq: Once | INTRAVENOUS | Status: DC

## 2016-04-26 MED ORDER — MAGNESIUM SULFATE 4 GM/100ML IV SOLN
4.0000 g | Freq: Once | INTRAVENOUS | Status: AC
  Administered 2016-04-26: 4 g via INTRAVENOUS
  Filled 2016-04-26: qty 100

## 2016-04-26 MED ORDER — NICOTINE 7 MG/24HR TD PT24
7.0000 mg | MEDICATED_PATCH | Freq: Every day | TRANSDERMAL | Status: DC
  Administered 2016-04-26 – 2016-04-30 (×5): 7 mg via TRANSDERMAL
  Filled 2016-04-26 (×6): qty 1

## 2016-04-26 MED ORDER — SODIUM CHLORIDE 0.9 % IV BOLUS (SEPSIS)
500.0000 mL | Freq: Once | INTRAVENOUS | Status: AC
  Administered 2016-04-26: 500 mL via INTRAVENOUS

## 2016-04-26 MED ORDER — SODIUM CHLORIDE 0.9% FLUSH
10.0000 mL | Freq: Two times a day (BID) | INTRAVENOUS | Status: DC
  Administered 2016-04-26 – 2016-04-27 (×2): 10 mL

## 2016-04-26 MED ORDER — CHLORHEXIDINE GLUCONATE CLOTH 2 % EX PADS
6.0000 | MEDICATED_PAD | Freq: Every day | CUTANEOUS | Status: DC
  Administered 2016-04-27: 6 via TOPICAL

## 2016-04-26 MED ORDER — LACTATED RINGERS IV BOLUS (SEPSIS)
1000.0000 mL | Freq: Once | INTRAVENOUS | Status: AC
  Administered 2016-04-26: 1000 mL via INTRAVENOUS

## 2016-04-26 MED ORDER — FUROSEMIDE 10 MG/ML IJ SOLN
20.0000 mg | Freq: Once | INTRAMUSCULAR | Status: AC
  Administered 2016-04-26: 20 mg via INTRAVENOUS

## 2016-04-26 MED ORDER — PHENOL 1.4 % MT LIQD
1.0000 | OROMUCOSAL | Status: DC | PRN
  Administered 2016-04-27: 1 via OROMUCOSAL
  Filled 2016-04-26 (×2): qty 177

## 2016-04-26 MED ORDER — PHENTOLAMINE MESYLATE 5 MG IJ SOLR
5.0000 mg | Freq: Once | INTRAMUSCULAR | Status: AC
Start: 2016-04-26 — End: 2016-04-26
  Administered 2016-04-26: 5 mg via SUBCUTANEOUS
  Filled 2016-04-26: qty 5

## 2016-04-26 MED ORDER — BENZONATATE 100 MG PO CAPS
200.0000 mg | ORAL_CAPSULE | Freq: Three times a day (TID) | ORAL | Status: DC
  Administered 2016-04-27 – 2016-04-30 (×11): 200 mg via ORAL
  Filled 2016-04-26 (×11): qty 2

## 2016-04-26 MED ORDER — PHENYLEPHRINE HCL 10 MG/ML IJ SOLN
0.0000 ug/min | INTRAMUSCULAR | Status: DC
  Administered 2016-04-26: 150 ug/min via INTRAVENOUS
  Administered 2016-04-26: 45 ug/min via INTRAVENOUS
  Administered 2016-04-26: 170 ug/min via INTRAVENOUS
  Filled 2016-04-26 (×3): qty 4

## 2016-04-26 MED ORDER — DEXTROSE-NACL 5-0.45 % IV SOLN
INTRAVENOUS | Status: DC
  Administered 2016-04-26 – 2016-04-27 (×2): via INTRAVENOUS

## 2016-04-26 MED ORDER — FUROSEMIDE 10 MG/ML IJ SOLN
INTRAMUSCULAR | Status: AC
  Filled 2016-04-26: qty 2

## 2016-04-26 MED ORDER — IBUPROFEN 200 MG PO TABS
600.0000 mg | ORAL_TABLET | Freq: Four times a day (QID) | ORAL | Status: DC | PRN
  Administered 2016-04-26: 600 mg via ORAL
  Filled 2016-04-26: qty 3

## 2016-04-26 NOTE — Progress Notes (Signed)
eLink Physician-Brief Progress Note Patient Name: Suzanne Hill DOB: October 30, 1995 MRN: [id number]   Date of Service  04/26/2016  HPI/Events of Note  Called into room for desats related to coughing She desat to 76% then when cough resolved she recovered to 97% RR 18 speaking, denied drug use in the room RN says moving air in neck without stridor and lungs coarse  eICU Interventions  Decadron x 1 Lasix x 1 pcxr Additional o2 to support sats as needed     Intervention Category Major Interventions: Airway management  Nelda Bucks. 04/26/2016, 10:17 PM

## 2016-04-26 NOTE — Procedures (Signed)
Extubation Procedure Note  Patient Details:   Name: Suzanne Hill DOB: Nov 18, 1995 MRN: [id number]   Airway Documentation:     Evaluation  O2 sats: stable throughout Complications: No apparent complications Patient did tolerate procedure well. Bilateral Breath Sounds: Clear, Diminished   Yes  [name] 04/26/2016, 10:19 AM

## 2016-04-26 NOTE — Progress Notes (Addendum)
When Rahul was placing central line, he advised the patient looked dry and to get a CVP once placement confirmed.  Placement was confirmed.  CVP pressure line set-up.  CVP measurement was 3.  Due to Rahul not presently on the unit, elink MD notified.  I recommended an order for fluids due to CVP being low.  Order for of NS x1 given.  NS bolus started.  Jewel Baize Kensly Bowmer,RN,BSN,CCRN [facility] ICU/SD Night Care Coordinator / Rapid Response Nurse

## 2016-04-26 NOTE — Progress Notes (Signed)
RT pulled E.T. Tube back X1 cm per last CXR suggestion @ 0440, just prior to schedule repeat CXR.

## 2016-04-26 NOTE — Progress Notes (Signed)
Initial Nutrition Assessment  DOCUMENTATION CODES:   Not applicable  INTERVENTION:  - Will monitor for needs with diet advancement following extubation.   NUTRITION DIAGNOSIS:   Inadequate oral intake related to inability to eat as evidenced by NPO status.  GOAL:   Patient will meet greater than or equal to 90% of their needs  MONITOR:   Vent status, Weight trends, Labs  REASON FOR ASSESSMENT:   Ventilator  ASSESSMENT:   21 year old female w/out sig medical history. Uses marijuana for self-medication for insomnia and anxiety. Was found unresponsive outside a half-way house on 4/10. Was administered intra-nasal Narcan bye bystander & EMS called. On arrival to ER was combative and agitated. Hypoxic w/ sats in 80s on Non-rebreather mask w/ lactic acid 15.7 and SBP in 80s-90s.  Pt seen for new vent. BMI indicates normal weight. Pt was intubated yesterday ~1715 and OGT placed at that time. Mom and boyfriend are at bedside and mom provides information. She states that PTA pt had a very good appetite and could "eat like a field hen" indicating that pt could eat large quantities each day and enjoyed eating. RN reported that pt is weaning at this time and that pt is a confirmed extubation. Notes indicate pt with septic shock d/t aspiration and that septic shock now resolved.   Physical assessment showed no muscle or fat wasting and no edema at this time. Mom denies any recent weight changes. No weight hx since 05/07/13.  Patient is currently intubated on ventilator support MV: 6.1 L/min Temp (24hrs), Avg:98.2 F (36.8 C), Min:85 F (29.4 C), Max:100.8 F (38.2 C)  Medications reviewed; 20 mg Pepcid/day, 10 mEq KCl x3 runs yesterday.  Labs reviewed; Cl: 114 mmol/L, Ca: 7.1 mg/dL, Mg: 1.2 mg/dL.  IVF: D5-1/2 NS @ 10 mL/hr (41 kcal from dextrose).    Diet Order:  Diet NPO time specified  Skin:  Reviewed, no issues  Last BM:  PTA/unknown  Height:   Ht Readings from Last 1  Encounters:  04/25/16  (1.676 m)    Weight:   Wt Readings from Last 1 Encounters:  04/26/16 115 lb 11.9 oz (52.5 kg)    Ideal Body Weight:  59.09 kg  BMI:  Body mass index is 18.68 kg/m.  Estimated Nutritional Needs:   Kcal:  1621  Protein:  63-79 grams (1.2-1.5 grams/kg)  Fluid:  1.6-1.8 L/day  EDUCATION NEEDS:   No education needs identified at this time    Trenton Gammon, MS, RD, LDN, CNSC Inpatient Clinical Dietitian Pager # [phone] After hours/weekend pager # [phone]

## 2016-04-26 NOTE — Progress Notes (Signed)
PULMONARY / CRITICAL CARE MEDICINE   Name: Suzanne Hill MRN: [id number] DOB: 1995/08/03    ADMISSION DATE:  04/25/2016 CONSULTATION DATE:  4/10  REFERRING MD:  Effie Shy   CHIEF COMPLAINT:  Overdose, aspiration and sepsis   SUBJECTIVE:  Sedated on vent  VITAL SIGNS: BP (!) 119/58   Pulse 96   Temp 99.9 F (37.7 C)   Resp (!) 24   Ht  (1.676 m)   Wt 115 lb 11.9 oz (52.5 kg)   LMP  (LMP Unknown) Comment: pt not verbally responding  SpO2 100%   BMI 18.68 kg/m   HEMODYNAMICS: CVP:  [3 mmHg] 3 mmHg  VENTILATOR SETTINGS: Vent Mode: PRVC FiO2 (%):  [15 %-100 %] 40 % Set Rate:  [24 bmp] 24 bmp Vt Set:  [40 mL-400 mL] 40 mL PEEP:  [10 cmH20] 10 cmH20 Plateau Pressure:  [20 cmH20-23 cmH20] 23 cmH20  INTAKE / OUTPUT: I/O last 3 completed shifts: In: 5692.3 [I.V.:2192.3; IV Piggyback:3500] Out: 740 [Urine:740]  PHYSICAL EXAMINATION: General appearance:  21 Year old female, well nourished sedated on vent  Eyes: anicteric sclerae, moist conjunctivae; PERRL, EOMI bilaterally. Mouth:  membranes and no mucosal ulcerations; normal hard and soft palate, orally intubated Neck: Trachea midline; neck supple, no JVD Lungs/chest: rales R>L, with normal respiratory effort and no intercostal retractions CV: RRR, no MRGs  Abdomen: Soft, non-tender; no masses or HSM Extremities: trace peripheral edema or extremity lymphadenopathy Skin: Normal temperature, turgor and texture; no rash, ulcers or subcutaneous nodules Neuro/Psych: sedated. No focal def  LABS:  BMET  Recent Labs Lab 04/25/16 1247 04/25/16 1629 04/26/16 0428  NA 140 141 140  K 5.1 3.3* 3.7  CL 104 113* 114*  CO2 16* 24 22  BUN 17 21* 16  CREATININE 1.14* 0.74 0.77  GLUCOSE 223* 47* 75    Electrolytes  Recent Labs Lab 04/25/16 1247 04/25/16 1629 04/25/16 1830 04/26/16 0428  CALCIUM 9.0 7.5*  --  7.1*  MG  --   --  1.7 1.2*  PHOS  --   --  2.9 2.9    CBC  Recent Labs Lab 04/25/16 1247  04/26/16 0428  WBC 19.9* 21.3*  HGB 15.0 10.0*  HCT 45.3 29.1*  PLT 343 183    Coag's  Recent Labs Lab 04/25/16 1629  APTT 25  INR 1.35    Sepsis Markers  Recent Labs Lab 04/25/16 1554 04/25/16 1629 04/25/16 1830 04/26/16 0428  LATICACIDVEN 2.78* 2.0* 2.8*  --   PROCALCITON  --  4.35  --  9.78    ABG  Recent Labs Lab 04/25/16 1740 04/26/16 0500  PHART 7.311* 7.332*  PCO2ART 46.7 39.5  PO2ART 246* 114*    Liver Enzymes  Recent Labs Lab 04/25/16 1247 04/25/16 1629  AST 52* 55*  ALT 17 17  ALKPHOS 127* 68  BILITOT 1.0 0.7  ALBUMIN 4.5 3.4*    Cardiac Enzymes  Recent Labs Lab 04/25/16 1830 04/25/16 2330 04/26/16 0428  TROPONINI 0.03* 0.04* 0.03*    Glucose  Recent Labs Lab 04/25/16 1811 04/25/16 1837  GLUCAP 39* 107*    Imaging Dg Abd 1 View  Result Date: 04/25/2016 CLINICAL DATA:  Orogastric tube placement.  Initial encounter. EXAM: ABDOMEN - 1 VIEW COMPARISON:  CT of the abdomen and pelvis from 05/07/2013 FINDINGS: The patient's enteric tube is noted ending overlying the body of the stomach. Diffuse bilateral airspace opacification is again noted, which may reflect pulmonary edema or pneumonia. Aspiration cannot be excluded. No acute  osseous abnormalities are seen. The visualized bowel gas pattern is grossly unremarkable. IMPRESSION: 1. Enteric tube noted ending overlying the body of the stomach. 2. Diffuse bilateral airspace opacification again noted, which may reflect pulmonary edema or pneumonia. Aspiration cannot be excluded. Electronically Signed   By: Roanna Raider M.D.   On: 04/25/2016 21:09   Dg Chest Port 1 View  Result Date: 04/26/2016 CLINICAL DATA:  Endotracheal tube repositioning.  Initial encounter. EXAM: PORTABLE CHEST 1 VIEW COMPARISON:  Chest radiograph performed earlier today at 2:34 a.m. FINDINGS: The patient's endotracheal tube is seen ending 3 cm above the carina. An enteric tube is noted extending below the diaphragm.  A right IJ line is noted ending about the cavoatrial junction. Diffuse bilateral airspace opacification has improved from the prior study. No pleural effusion or pneumothorax is seen. Vascular congestion is noted. The cardiomediastinal silhouette is normal in size. No acute osseous abnormalities are identified. IMPRESSION: 1. Endotracheal tube seen ending 3 cm above the carina. 2. Diffuse bilateral airspace opacification is improved from the prior study. Vascular congestion. This may reflect improving pulmonary edema or pneumonia. Electronically Signed   By: Roanna Raider M.D.   On: 04/26/2016 05:14   Dg Chest Port 1 View  Result Date: 04/26/2016 CLINICAL DATA:  Line placement EXAM: PORTABLE CHEST 1 VIEW COMPARISON:  04/25/2016 at 1700 hours FINDINGS: Right IJ central line catheter is noted with tip at the cavoatrial junction. No pneumothorax. Gastric tube extends below the left hemidiaphragm. Endotracheal tube is 2.7 cm above the carina slightly lower than prior and pullback 1 cm suggested. Patchy right greater than left airspace opacities are again noted without significant change. No acute nor suspicious osseous abnormalities. IMPRESSION: 1. Slightly lower lying endotracheal tube tip at 2.7 cm above the carina. Slight pullback is recommended 1 cm. 2. Gastric tube extends below the left hemidiaphragm into the expected location of the stomach. The tip is excluded on this study. 3. Right IJ central line catheter tip at the cavoatrial junction. No pneumothorax. 4. Right greater than left airspace disease persists though without significant worsening. Findings may represent pneumonia, aspiration or asymmetric pulmonary edema. Electronically Signed   By: Tollie Eth M.D.   On: 04/26/2016 03:08   Dg Chest Port 1 View  Result Date: 04/25/2016 CLINICAL DATA:  Respiratory failure.  Ventilator support. EXAM: PORTABLE CHEST 1 VIEW COMPARISON:  Earlier same day FINDINGS: Endotracheal tube tip 4 cm above the carina.  Nasogastric tube enters the abdomen. Better lung inflation following intubation. Persistent bilateral airspace filling in a pattern that could represent a combination of edema, pneumonia and aspiration. IMPRESSION: Endotracheal tube and nasogastric tube well positioned. Better aeration of the lungs. Persistent airspace filling which could be a combination of edema, pneumonia and aspiration. Electronically Signed   By: Paulina Fusi M.D.   On: 04/25/2016 17:42   Dg Chest Port 1 View  Result Date: 04/25/2016 CLINICAL DATA:  Drug overdose EXAM: PORTABLE CHEST 1 VIEW COMPARISON:  PA and lateral chest x-ray of January 29, 2007 FINDINGS: There are fluffy alveolar opacities bilaterally. The pulmonary vascularity is indistinct. The cardiac silhouette is normal in size. There is no pleural effusion or pneumothorax. The mediastinum is normal in width. The bony thorax exhibits no acute abnormality. IMPRESSION: Bilateral pulmonary interstitial and alveolar edema. Bilateral pneumonia is felt less likely. Electronically Signed   By: David  [place] M.D.   On: 04/25/2016 12:42  PCXR R>L patchy pulmonary infiltrates -->improved aeration   STUDIES:  ECHO 4/11>>  CULTURES: UC 4/10>>> BCX2 4/10>>> Sputum 4/10: abd wbc no orgs>>>  ANTIBIOTICS: vanc 4/10>>> Zosyn 4/10>>>  SIGNIFICANT EVENTS: 4/10 intubated after failing attempts at high flow 4/11 gas exchanged better. CXR improved. Weaning. ECHO ordered   LINES/TUBES: 4.10 ETT>>> Right IJ CVL 4/10>>>    ASSESSMENT / PLAN:  Acute Hypoxic respiratory Failure in setting of aspiration PNA -likely complicated by element of pulmonary edema. This raises concern for cardiac fxn (although this would be unusual at age 22 unless we are dealing w/ endocarditis or takotsubo physiology). Currently CXR improved as has gas exchange Plan D/c sedation gtt RAS goal 0 SBT and probably extubate this am ECHO to r/o endocarditis or valve issues f/u sputum culture Day 1/x  vanc/zosyn  Septic Shock due to aspiration ->septic shock resolved Hypotension in setting of sedating meds Plan See abx above Wean neo f/u blood cultures Even to neg volume goal at this point   AKI & lactic acidosis-->resolved Plan Even to negative volume status  Replace lytes as needed  Hypomagnesemia Plan Replace and recheck  Acute encephalopathy. D/t heroin overdose  Plan Dc sedation  Supportive care   FAMILY  - Updates:   - Inter-disciplinary family meet or Palliative Care meeting due by: 4/17  DISCUSSION:  Acute Hypoxic respiratory Failure in setting of aspiration PNA -likely complicated by element of pulmonary edema. This raises concern for cardiac fxn (although this would be unusual at age 47 unless we are dealing w/ endocarditis or takotsubo physiology). Currently CXR improved as has gas exchange For today -dc sedating gtts -wean on PEEP 0 to ensure no flash edema -ECHO to r/o valvular issues.  -anticipate extubation soon -f/u cultures; no change in abx for now   My critical care time 35 minutes.   Simonne Martinet ACNP-BC [facility] Pulmonary/Critical Care Pager # [phone] OR # [phone] if no answer 04/26/2016, 8:44 AM  Attending Note:  I have examined patient, reviewed labs, studies and notes. I have discussed the case with Kreg Shropshire, and I agree with the data and plans as amended above. 21 year old woman admitted with obtundation and acute respiratory failure in the setting of an accidental narcotics overdose. She required intubation for airway protection and bilateral pulmonary infiltrates, suspect an acute lung injury +/-  aspiration pneumonia. Consider also acute cardiogenic pulmonary edema. She has been in shock, suspect related to her narcotics and to sedating drips. Consider also sepsis given the risk for pneumonia. Her chest x-ray has improved, appears to be resolving quickly. Acute renal insufficiency is also improving. On evaluation this morning she  remains on phenylephrine 100, she is mechanically ventilated and is sedated. She did wake to voice, was startled but then calmed and tracked. She nodded questions. She did not follow my instructions. She has a spontaneous respiratory effort. Lungs have bilateral soft inspiratory crackles. Heart is regular and I do not hear a murmur. Abdomen is soft, benign positive bowel sounds. She has some old bruises on her bilateral shins. No significant edema. We will decrease her sedation, hopefully this will allow [place] to pursue spontaneous breathing and consider her for extubation. Prior to extubation we will ensure that she does not develop acute pulmonary edema off of PEEP. Hopefully we will also be able to wean her phenylephrine, change goal to systolic blood pressure greater than 90. Continue broad-spectrum antibiotics pending her culture data given risk for aspiration pneumonia. She needs an echocardiogram to evaluate for possible cardiomyopathy, evaluate valvular function given risk for infectious endocarditis with IV drug  use. Independent critical care time is 40 minutes.   Levy Pupa, MD, PhD 04/26/2016, 9:28 AM [place] [facility] [phone] or if no answer [phone]

## 2016-04-26 NOTE — Progress Notes (Signed)
eLink Physician-Brief Progress Note Patient Name: Deondria Puryear DOB: 28-Mar-1995 MRN: [id number]   Date of Service  04/26/2016  HPI/Events of Note  agitation, smoker  eICU Interventions  asking for patch Provide 7 mg , as less then 1 pack per day     Intervention Category Minor Interventions: Routine modifications to care plan (e.g. PRN medications for pain, fever)  Nelda Bucks. 04/26/2016, 5:14 PM

## 2016-04-26 NOTE — Procedures (Signed)
Central Venous Catheter Insertion Procedure Note Suzanne Hill [id number] [birth year]  Procedure: Insertion of Central Venous Catheter Indications: Assessment of intravascular volume, Drug and/or fluid administration and Frequent blood sampling  Procedure Details Consent: Risks of procedure as well as the alternatives and risks of each were explained to the (patient/caregiver).  Consent for procedure obtained. Time Out: Verified patient identification, verified procedure, site/side was marked, verified correct patient position, special equipment/implants available, medications/allergies/relevent history reviewed, required imaging and test results available.  Performed  Maximum sterile technique was used including antiseptics, cap, gloves, gown, hand hygiene, mask and sheet. Skin prep: Chlorhexidine; local anesthetic administered A antimicrobial bonded/coated triple lumen catheter was placed in the right internal jugular vein using the Seldinger technique.  Evaluation Blood flow good Complications: No apparent complications Patient did tolerate procedure well. Chest X-ray ordered to verify placement.  CXR: pending.  Procedure performed under direct ultrasound guidance for real time vessel cannulation.      Rutherford Guys, [place] - C [facility] & Critical [facility] Medicine Pager: [phone]  or [phone] 04/26/2016, 2:31 AM

## 2016-04-26 NOTE — Progress Notes (Signed)
  Echocardiogram 2D Echocardiogram has been performed.  Suzanne Hill 04/26/2016, 1:30 PM

## 2016-04-26 NOTE — Progress Notes (Addendum)
RN notified E-Link MD regarding pt BP in correlation with increasing rate of Neo-Synephrine gtt. RN informed MD of CVP reading of 3. MD ordered 1L LR bolus, 20 mg of Lasix to be held, and to increased Neo-Synephrine from 200 mcg to 250 mcg now. Will continue to monitor.

## 2016-04-26 NOTE — Progress Notes (Signed)
70 mL of Fentanyl wasted into sink, witnessed by Verl Blalock, RN.

## 2016-04-26 NOTE — Progress Notes (Addendum)
0130, upon assessment RN noted right arm appearing to be swollen around IV site. Right arm IV site has Neo-Synephrine running to it. RN contacted E-Link MD. E-Link MD camera in to pt room and viewed pt right arm in comparison to left. RN noted a 2.5 inch difference in circumference, the right arm measuring larger than the left. Per E-Link MD, orders were received for Regitine to be given SQ. Verl Blalock, RN consulted with IV team and pharmacist regarding administration. Verl Blalock, RN administered SQ Regitine. Right arm site appears edematous, is mildly cooler to touch at the site, non-blanchable. Peripheral pulses palpable in RUE. Right arm IV has now been removed. Will continue to monitor.   0600. Infiltration site is now warm to touch and blanchable. Area around site appears edematous. Will continue to monitor.

## 2016-04-26 NOTE — Progress Notes (Signed)
Interval  Placed pt on Peep 0/ peep 5.  Turned sedating meds off on first rounds.   F/vt excellent w/ Vts in 400-500 ml  Fully awake and was following commands w/ only issue was slightly more anxious w/ meds off and more tachycardic.   Extubated to [place]. Sats initially dropped to 78% but quickly rebounded to 95% on 6 liters Very emotional, remorseful and tearful w/ family but in no distress.   Plan Will wean O2 Add IS Continue to provide emotional support.  She is a little tachycardic but HR improving as she calms down emotionally & CVP is ~ 6 to 8 so not pressed to push fluid here.   Additional critical care time  45 minutes  Simonne Martinet ACNP-BC [facility] Pulmonary/Critical Care Pager # [phone] OR # [phone] if no answer

## 2016-04-26 NOTE — Progress Notes (Signed)
eLink Physician-Brief Progress Note Patient Name: Suzanne Hill DOB: 31-May-1995 MRN: [id number]   Date of Service  04/26/2016  HPI/Events of Note  Cough  Tens pearls  eICU Interventions       Intervention Category Minor Interventions: Routine modifications to care plan (e.g. PRN medications for pain, fever)  Nelda Bucks. 04/26/2016, 10:07 PM

## 2016-04-26 NOTE — Progress Notes (Signed)
eLink Physician-Brief Progress Note Patient Name: Ravan Schlemmer DOB: 1995-02-24 MRN: [id number]   Date of Service  04/26/2016  HPI/Events of Note  Throat pain after extubation  eICU Interventions  chrloraseptioc     Intervention Category Minor Interventions: Routine modifications to care plan (e.g. PRN medications for pain, fever)  Nelda Bucks. 04/26/2016, 8:38 PM

## 2016-04-26 NOTE — Progress Notes (Signed)
RN notified E-Link MD regarding pt Neo-Synephrine gtt. RN started shift with gtt running at 25 mcg, once sedation was started, RN has had to increased gtt to 100 mcg and still increasing. RN updated MD regarding pt having peripheral IVs, pt current and trending BPs, increase in Neo-Synephrine gtt rate, and requested secondary BP medication be added. MD advised RN to continue to increase Neo-Synephrine gtt rate. No additional orders. Will continue to monitor.

## 2016-04-26 NOTE — Progress Notes (Signed)
eLink Physician-Brief Progress Note Patient Name: Jaycelynn Knickerbocker DOB: 1995-10-23 MRN: [id number]   Date of Service  04/26/2016  HPI/Events of Note  Right arm swelling after infiltrate of peripheral line running phenylephrine.   eICU Interventions  Will plan for central line. Phentolamine SQ to affected area.      Intervention Category Intermediate Interventions: Other:  Shane Crutch 04/26/2016, 1:55 AM

## 2016-04-26 NOTE — Care Management Note (Signed)
Case Management Note  Patient Details  Name: Suzanne Hill MRN: [id number] Date of Birth: 1995-10-04  Subjective/Objective:    Overdose requiring intubation                Action/Plan: Date:  April 26, 2016 Chart reviewed for concurrent status and case management needs. Will continue to follow patient progress. Discharge Planning: following for needs Expected discharge date: [id number] Marcelle Smiling, BSN, [place], [place]   [phone]  Expected Discharge Date:   (unknown)               Expected Discharge Plan:  Home/Self Care  In-House Referral:  Clinical Social Work  Discharge planning Services  CM Consult  Post Acute Care Choice:    Choice offered to:     DME Arranged:    DME Agency:     HH Arranged:    HH Agency:     Status of Service:  In process, will continue to follow  If discussed at Long Length of Stay Meetings, dates discussed:    Additional Comments:  Golda Acre, RN 04/26/2016, 9:47 AM

## 2016-04-26 NOTE — Social Work (Signed)
CSW consult as pt possibly admitted from halfway house. Pt currently intubated. Per H&P, pt from home with parents and was found down in front of halfway house. CSW will address consult when pt more appropriate for conversation.   Dellie Burns, MSW, LCSW [phone] (coverage)

## 2016-04-27 ENCOUNTER — Inpatient Hospital Stay (HOSPITAL_COMMUNITY): Payer: Self-pay

## 2016-04-27 DIAGNOSIS — R579 Shock, unspecified: Secondary | ICD-10-CM | POA: Diagnosis not present

## 2016-04-27 DIAGNOSIS — J9601 Acute respiratory failure with hypoxia: Secondary | ICD-10-CM | POA: Diagnosis not present

## 2016-04-27 DIAGNOSIS — J690 Pneumonitis due to inhalation of food and vomit: Secondary | ICD-10-CM | POA: Diagnosis not present

## 2016-04-27 DIAGNOSIS — T50901S Poisoning by unspecified drugs, medicaments and biological substances, accidental (unintentional), sequela: Secondary | ICD-10-CM | POA: Diagnosis not present

## 2016-04-27 LAB — DIC (DISSEMINATED INTRAVASCULAR COAGULATION) PANEL
APTT: 39 s — AB (ref 24–36)
D DIMER QUANT: 1.9 ug{FEU}/mL — AB (ref 0.00–0.50)
FIBRINOGEN: 741 mg/dL — AB (ref 210–475)
INR: 1.61
PLATELETS: 167 10*3/uL (ref 150–400)
PROTHROMBIN TIME: 19.3 s — AB (ref 11.4–15.2)

## 2016-04-27 LAB — COMPREHENSIVE METABOLIC PANEL
ALT: 21 U/L (ref 14–54)
ANION GAP: 6 (ref 5–15)
AST: 44 U/L — AB (ref 15–41)
Albumin: 3.1 g/dL — ABNORMAL LOW (ref 3.5–5.0)
Alkaline Phosphatase: 57 U/L (ref 38–126)
BILIRUBIN TOTAL: 0.9 mg/dL (ref 0.3–1.2)
BUN: 7 mg/dL (ref 6–20)
CHLORIDE: 104 mmol/L (ref 101–111)
CO2: 28 mmol/L (ref 22–32)
Calcium: 8.1 mg/dL — ABNORMAL LOW (ref 8.9–10.3)
Creatinine, Ser: 0.61 mg/dL (ref 0.44–1.00)
GFR calc Af Amer: >60 mL/min (ref >60)
Glucose, Bld: 136 mg/dL — ABNORMAL HIGH (ref 65–99)
POTASSIUM: 3.8 mmol/L (ref 3.5–5.1)
Sodium: 138 mmol/L (ref 135–145)
TOTAL PROTEIN: 5.7 g/dL — AB (ref 6.5–8.1)

## 2016-04-27 LAB — CBC
HEMATOCRIT: 29.8 % — AB (ref 36.0–46.0)
HEMOGLOBIN: 10.4 g/dL — AB (ref 12.0–15.0)
MCH: 32.4 pg (ref 26.0–34.0)
MCHC: 34.9 g/dL (ref 30.0–36.0)
MCV: 92.8 fL (ref 78.0–100.0)
Platelets: 145 10*3/uL — ABNORMAL LOW (ref 150–400)
RBC: 3.21 MIL/uL — AB (ref 3.87–5.11)
RDW: 12.4 % (ref 11.5–15.5)
WBC: 19.1 10*3/uL — AB (ref 4.0–10.5)

## 2016-04-27 LAB — PROCALCITONIN: PROCALCITONIN: 3.5 ng/mL

## 2016-04-27 LAB — MAGNESIUM: Magnesium: 1.8 mg/dL (ref 1.7–2.4)

## 2016-04-27 LAB — DIC (DISSEMINATED INTRAVASCULAR COAGULATION)PANEL: Smear Review: NONE SEEN

## 2016-04-27 MED ORDER — FUROSEMIDE 10 MG/ML IJ SOLN
40.0000 mg | Freq: Once | INTRAMUSCULAR | Status: AC
  Administered 2016-04-27: 40 mg via INTRAVENOUS
  Filled 2016-04-27: qty 4

## 2016-04-27 NOTE — Progress Notes (Addendum)
PULMONARY / CRITICAL CARE MEDICINE   Name: Suzanne Hill MRN: [id number] DOB: Dec 11, 1995    ADMISSION DATE:  04/25/2016 CONSULTATION DATE:  4/10  REFERRING MD:  Effie Shy   CHIEF COMPLAINT:  Overdose, aspiration and sepsis   SUBJECTIVE:  Extubated successfully 4/11 Had cough and sore throat last pm, received steroids x 1, lasix x 1   VITAL SIGNS: BP 107/60   Pulse (!) 108   Temp 99.7 F (37.6 C)   Resp (!) 41   Ht  (1.676 m)   Wt 52.5 kg (115 lb 11.9 oz)   LMP  (LMP Unknown) Comment: pt not verbally responding  SpO2 100%   BMI 18.68 kg/m   HEMODYNAMICS:    VENTILATOR SETTINGS: Vent Mode: PSV;CPAP FiO2 (%):  [40 %] 40 % PEEP:  [0 cmH20] 0 cmH20  INTAKE / OUTPUT: I/O last 3 completed shifts: In: 5474.9 [P.O.:120; I.V.:3404.9; IV Piggyback:1950] Out: 5840 [Urine:5840]  PHYSICAL EXAMINATION: General appearance:  Young woman, more awake today, more comfortable today Eyes: No icterus, pupils equal and reactive Mouth:  No oral lesions Neck: Neck supple, no JVD, no stridor Lungs/chest: Normal respiratory effort, patient coughs with deep inspiration, no wheezing, few bibasilar inspiratory crackles CV: Regular, no murmur Abdomen: Soft, benign, positive bowel sounds Extremities: No edema Skin: No rash Neuro/Psych: Awake, appropriate, nonfocal LABS:  BMET  Recent Labs Lab 04/25/16 1629 04/26/16 0428 04/27/16 0536  NA 141 140 138  K 3.3* 3.7 3.8  CL 113* 114* 104  CO2 BUN 21* 16 7  CREATININE 0.74 0.77 0.61  GLUCOSE 47* 75 136*    Electrolytes  Recent Labs Lab 04/25/16 1629 04/25/16 1830 04/26/16 0428 04/27/16 0536  CALCIUM 7.5*  --  7.1* 8.1*  MG  --  1.7 1.2* 1.8  PHOS  --  2.9 2.9  --     CBC  Recent Labs Lab 04/25/16 1247 04/26/16 0428 04/27/16 0536  WBC 19.9* 21.3* 19.1*  HGB 15.0 10.0* 10.4*  HCT 45.3 29.1* 29.8*  PLT 343 183 145*    Coag's  Recent Labs Lab 04/25/16 1629  APTT 25  INR 1.35    Sepsis  Markers  Recent Labs Lab 04/25/16 1554 04/25/16 1629 04/25/16 1830 04/26/16 0428 04/27/16 0536  LATICACIDVEN 2.78* 2.0* 2.8*  --   --   PROCALCITON  --  4.35  --  9.78 3.50    ABG  Recent Labs Lab 04/25/16 1740 04/26/16 0500  PHART 7.311* 7.332*  PCO2ART 46.7 39.5  PO2ART 246* 114*    Liver Enzymes  Recent Labs Lab 04/25/16 1247 04/25/16 1629 04/27/16 0536  AST 52* 55* 44*  ALT ALKPHOS 127* 68 57  BILITOT 1.0 0.7 0.9  ALBUMIN 4.5 3.4* 3.1*    Cardiac Enzymes  Recent Labs Lab 04/25/16 1830 04/25/16 2330 04/26/16 0428  TROPONINI 0.03* 0.04* 0.03*    Glucose  Recent Labs Lab 04/25/16 1811 04/25/16 1837  GLUCAP 39* 107*    Imaging Dg Chest Port 1 View  Result Date: 04/27/2016 CLINICAL DATA:  Substance abuse, acute respiratory failure, pulmonary edema, pneumonia. EXAM: PORTABLE CHEST 1 VIEW COMPARISON:  Portable chest x-ray of November 26, 2016 FINDINGS: The lungs are less well inflated today. Fluffy alveolar opacities persist bilaterally that but are less dense. The cardiac silhouette is normal in size. The pulmonary vascularity is indistinct. The right internal jugular venous catheter tip projects over the cavoatrial junction. IMPRESSION: Mild hypoinflation. Persistent fluffy interstitial and alveolar opacities consistent  with edema or pneumonia. Electronically Signed   By: David  [place] M.D.   On: 04/27/2016 07:02   Dg Chest Port 1 View  Result Date: 04/26/2016 CLINICAL DATA:  Acute onset of respiratory failure. Initial encounter. EXAM: PORTABLE CHEST 1 VIEW COMPARISON:  Chest radiograph performed earlier today at 4:24 a.m. FINDINGS: The lungs are well-aerated. Worsening fluffy bilateral airspace opacification raises concern for worsening pulmonary edema or pneumonia. No pleural effusion or pneumothorax is seen. The cardiomediastinal silhouette is mildly enlarged. No acute osseous abnormalities are identified. A right IJ line is noted ending  about the cavoatrial junction. IMPRESSION: 1. Worsening bilateral fluffy airspace opacification raises concern for worsening pulmonary edema or pneumonia. 2. Mild cardiomegaly. Electronically Signed   By: Roanna Raider M.D.   On: 04/26/2016 22:43  PCXR R>L patchy pulmonary infiltrates -->improved aeration   STUDIES:  ECHO 4/11>> normal LV size and function no evidence for systolic or diastolic dysfunction, normal valves, normal PA pressures, no evidence of endocarditis  CULTURES: UC 4/10>>> group B strep  BCX2 4/10>>> Sputum 4/10: abd wbc no orgs>>> MRSA screen 4/10 >> negative  ANTIBIOTICS: vanc 4/10>>> 4/12 Zosyn 4/10>>>  SIGNIFICANT EVENTS: 4/10 intubated after failing attempts at high flow 4/11 gas exchanged better. CXR improved. Weaning.   LINES/TUBES: 4.10 ETT>>> 4/11 Right IJ CVL 4/10>>>   ASSESSMENT / PLAN:  Acute Hypoxic respiratory Failure  aspiration pneumonitis +/- PNA  Consider acute lung injury due to toxic ingestion - Echocardiogram 4/11 reassuring Plan Repeat empiric diuresis as blood pressure and renal function will tolerate Pulmonary hygiene Antibiotics as below  Septic Shock due to aspiration ->septic shock resolved Hypotension in setting of sedating meds, resolved Group B strep urinary tract infection Plan Discontinue vancomycin 4/12 Continue Zosyn pending culture data, suspect we can narrow soon Phenylephrine weaned to off Follow blood cultures to completion given her risk for bacteremia, injection drug abuse  AKI & lactic acidosis-->resolved Plan Empiric diuresis as blood pressure and renal function will tolerate given her pulmonary infiltrates  Relative thrombocytopenia, consider due to sepsis, medications, consider HITT Plan Stop heparin Follow CBC Consider HITT panel, DIC panel  Hypomagnesemia Plan Follow BMP   Acute encephalopathy.  Toxic ingestion, narcotics abuse Plan Supportive care Social work consult regarding substance  abuse    FAMILY  - Updates: updated boyfriend at bedside when discussing w patient  - Inter-disciplinary family meet or Palliative Care meeting due by: 4/17  Change to stepdown status on 4/12 Transfer to [facility] hospitalists service as of 4/13  Independent critical care time is 35 minutes.   Levy Pupa, MD, PhD 04/27/2016, 8:32 AM [facility] [phone] or if no answer [phone]

## 2016-04-27 NOTE — Progress Notes (Signed)
LCSW attempted to complete consult: current substance abuse.  Attempted to meet with patient in room. She is alert and oriented and pleasant. Mother and boyfriend in room and patient does not give permission at this time to complete assessment. She requests LCSW come by after lunch.  Will attempt to see patient this afternoon to assess and begin assisting/devising plans once patient medically stable for discharge.  Deretha Emory, MSW Clinical Social Work: Optician, dispensing Coverage for :  [phone]

## 2016-04-27 NOTE — Progress Notes (Signed)
Nutrition Follow-up  DOCUMENTATION CODES:   Not applicable  INTERVENTION:  - Continue CLD and continue to advance diet as medically feasible. - RD will continue to monitor for needs.  NUTRITION DIAGNOSIS:   Inadequate protein intake related to other (see comment) (current diet order) as evidenced by other (see comment) (CLD does not meet estimated protein needs) -updated this AM.   GOAL:   Patient will meet greater than or equal to 90% of their needs -unmet  MONITOR:   PO intake, Diet advancement, Weight trends, Labs  ASSESSMENT:   21 year old female w/out sig medical history. Uses marijuana for self-medication for insomnia and anxiety. Was found unresponsive outside a half-way house on 4/10. Was administered intra-nasal Narcan bye bystander & EMS called. On arrival to ER was combative and agitated. Hypoxic w/ sats in 80s on Non-rebreather mask w/ lactic acid 15.7 and SBP in 80s-90s.  4/12 Pt was extubated as expected yesterday and OGT removed at that time. Estimated kcal needs updated d/t this event and reflect increased needs d/t sepsis; estimated protein needs remain the same d/t this same reason. New weight recording this AM is exactly the same as yesterday (52.5 kg). Diet advanced to CLD yesterday afternoon with no documented intakes. Pt and boyfriend sleeping this AM and pt on ventimask. Cup of juice with 50% completion on bedside table.   Medications reviewed; 40 mg IV Lasix x1 dose today, 4 mg IV Mg sulfate x1 run yesterday.  Labs reviewed; Ca: 8.1 mg/dL, AST: slightly elevated.  IVF: D5-1/2 NS @ 10 mL/hr (41 kcal from dextrose).    4/11 - Pt was intubated yesterday ~1715 and OGT placed at that time.  - Mom and boyfriend are at bedside and mom provides information.  - PTA pt had a very good appetite and could "eat like a field hen" indicating that pt could eat large quantities each day and enjoyed eating.  - RN reported that pt is weaning at this time and that pt is a  confirmed extubation.  - Notes indicate pt with septic shock d/t aspiration and that septic shock now resolved.  - Physical assessment showed no muscle or fat wasting and no edema at this time.  - Mom denies any recent weight changes. No weight hx since 05/07/13.  Patient is currently intubated on ventilator support MV: 6.1 L/min Temp (24hrs), Avg:98.2 F (36.8 C), Min:85 F (29.4 C), Max:100.8 F (38.2 C) Mg: 1.2 mg/dL. IVF: D5-1/2 NS @ 10 mL/hr (41 kcal from dextrose).    Diet Order:  Diet clear liquid Room service appropriate? Yes; Fluid consistency: Thin  Skin:  Reviewed, no issues  Last BM:  PTA/unknown  Height:   Ht Readings from Last 1 Encounters:  04/25/16  (1.676 m)    Weight:   Wt Readings from Last 1 Encounters:  04/27/16 115 lb 11.9 oz (52.5 kg)    Ideal Body Weight:  59.09 kg  BMI:  Body mass index is 18.68 kg/m.  Estimated Nutritional Needs:   Kcal:  1575-1735 (30-33 kcal/kg)  Protein:  63-79 grams (1.2-1.5 grams/kg)  Fluid:  1.6-1.8 L/day  EDUCATION NEEDS:   No education needs identified at this time    Trenton Gammon, MS, RD, LDN, CNSC Inpatient Clinical Dietitian Pager # [phone] After hours/weekend pager # [phone]

## 2016-04-28 ENCOUNTER — Inpatient Hospital Stay (HOSPITAL_COMMUNITY): Payer: Self-pay

## 2016-04-28 DIAGNOSIS — N390 Urinary tract infection, site not specified: Secondary | ICD-10-CM | POA: Diagnosis not present

## 2016-04-28 DIAGNOSIS — F1910 Other psychoactive substance abuse, uncomplicated: Secondary | ICD-10-CM | POA: Diagnosis not present

## 2016-04-28 DIAGNOSIS — T50902S Poisoning by unspecified drugs, medicaments and biological substances, intentional self-harm, sequela: Secondary | ICD-10-CM

## 2016-04-28 DIAGNOSIS — J9601 Acute respiratory failure with hypoxia: Secondary | ICD-10-CM | POA: Diagnosis not present

## 2016-04-28 DIAGNOSIS — E876 Hypokalemia: Secondary | ICD-10-CM | POA: Diagnosis not present

## 2016-04-28 DIAGNOSIS — E872 Acidosis: Secondary | ICD-10-CM | POA: Diagnosis not present

## 2016-04-28 DIAGNOSIS — R6521 Severe sepsis with septic shock: Secondary | ICD-10-CM | POA: Diagnosis not present

## 2016-04-28 DIAGNOSIS — A419 Sepsis, unspecified organism: Secondary | ICD-10-CM | POA: Diagnosis not present

## 2016-04-28 LAB — CULTURE, RESPIRATORY W GRAM STAIN: Culture: NORMAL

## 2016-04-28 LAB — CBC
HEMATOCRIT: 31.3 % — AB (ref 36.0–46.0)
Hemoglobin: 11.1 g/dL — ABNORMAL LOW (ref 12.0–15.0)
MCH: 32.2 pg (ref 26.0–34.0)
MCHC: 35.5 g/dL (ref 30.0–36.0)
MCV: 90.7 fL (ref 78.0–100.0)
PLATELETS: 200 10*3/uL (ref 150–400)
RBC: 3.45 MIL/uL — ABNORMAL LOW (ref 3.87–5.11)
RDW: 12.3 % (ref 11.5–15.5)
WBC: 20.8 10*3/uL — AB (ref 4.0–10.5)

## 2016-04-28 LAB — BASIC METABOLIC PANEL
ANION GAP: 8 (ref 5–15)
BUN: 12 mg/dL (ref 6–20)
CO2: 31 mmol/L (ref 22–32)
Calcium: 8.6 mg/dL — ABNORMAL LOW (ref 8.9–10.3)
Chloride: 100 mmol/L — ABNORMAL LOW (ref 101–111)
Creatinine, Ser: 0.62 mg/dL (ref 0.44–1.00)
GFR calc Af Amer: >60 mL/min (ref >60)
GFR calc non Af Amer: >60 mL/min (ref >60)
GLUCOSE: 110 mg/dL — AB (ref 65–99)
POTASSIUM: 3.1 mmol/L — AB (ref 3.5–5.1)
Sodium: 139 mmol/L (ref 135–145)

## 2016-04-28 LAB — CULTURE, RESPIRATORY: SPECIAL REQUESTS: NORMAL

## 2016-04-28 LAB — MAGNESIUM: Magnesium: 1.7 mg/dL (ref 1.7–2.4)

## 2016-04-28 LAB — HEPARIN INDUCED PLATELET AB (HIT ANTIBODY): HEPARIN INDUCED PLT AB: 0.254 {OD_unit} (ref 0.000–0.400)

## 2016-04-28 MED ORDER — SODIUM CHLORIDE 0.9 % IV SOLN: INTRAVENOUS | Status: DC

## 2016-04-28 MED ORDER — MAGNESIUM SULFATE 2 GM/50ML IV SOLN
2.0000 g | Freq: Once | INTRAVENOUS | Status: AC
  Administered 2016-04-28: 2 g via INTRAVENOUS
  Filled 2016-04-28: qty 50

## 2016-04-28 MED ORDER — POTASSIUM CHLORIDE CRYS ER 20 MEQ PO TBCR
40.0000 meq | EXTENDED_RELEASE_TABLET | ORAL | Status: AC
  Administered 2016-04-28 (×2): 40 meq via ORAL
  Filled 2016-04-28 (×2): qty 2

## 2016-04-28 MED ORDER — DEXTROSE-NACL 5-0.45 % IV SOLN: INTRAVENOUS | Status: DC

## 2016-04-28 NOTE — Progress Notes (Signed)
eLink Physician-Brief Progress Note Patient Name: Jezebelle Ledwell DOB: 1995/05/15 MRN: [id number]   Date of Service  04/28/2016  HPI/Events of Note  Hypokalemia and hypomag  eICU Interventions  Potassium and mag replaced     Intervention Category Intermediate Interventions: Electrolyte abnormality - evaluation and management  DETERDING,ELIZABETH 04/28/2016, 4:59 AM

## 2016-04-28 NOTE — Care Management Note (Signed)
Case Management Note  Patient Details  Name: Shernell Saldierna MRN: [id number] Date of Birth: 1995/05/12  Subjective/Objective: Transfer from SDU. CSW following for substance abuse. No CM needs.                  Action/Plan:d/c plan home.   Expected Discharge Date:   (unknown)               Expected Discharge Plan:  Home/Self Care  In-House Referral:  Clinical Social Work  Discharge planning Services  CM Consult  Post Acute Care Choice:    Choice offered to:     DME Arranged:    DME Agency:     HH Arranged:    HH Agency:     Status of Service:  In process, will continue to follow  If discussed at Long Length of Stay Meetings, dates discussed:    Additional Comments:  Lanier Clam, RN 04/28/2016, 1:48 PM

## 2016-04-28 NOTE — Progress Notes (Signed)
Pharmacy Antibiotic Note  Suzanne Hill is a 21 y.o. female admitted on 04/25/2016 with heroin OD and septic shock due to aspiration. Pharmacy has been consulted for Vancomycin and Zosyn dosing. Patient continues on empiric zosyn for aspiration pneumonia.   Today, 04/28/2016  Day #4 abx  Renal: SCr WNL  WBC remains elevated  GBS in Ucx (susc to PCNs)  Procalcitonin improved  afebrile  Plan:  Zosyn 3.375g IV q8h (infuse over 4 hours).  Suggest narrow to PO augmentin or IV unasyn  Monitor renal function, cultures, clinical course.  Height:  (167.6 cm) Weight: 115 lb 8.3 oz (52.4 kg) IBW/kg (Calculated) : 59.3  Temp (24hrs), Avg:99.7 F (37.6 C), Min:98.4 F (36.9 C), Max:100 F (37.8 C)   Recent Labs Lab 04/25/16 1247 04/25/16 1255 04/25/16 1554 04/25/16 1629 04/25/16 1830 04/26/16 0428 04/27/16 0536 04/28/16 0316  WBC 19.9*  --   --   --   --  21.3* 19.1* 20.8*  CREATININE 1.14*  --   --  0.74  --  0.77 0.61 0.62  LATICACIDVEN  --  15.70* 2.78* 2.0* 2.8*  --   --   --     Estimated Creatinine Clearance: 92 mL/min (by C-G formula based on SCr of 0.62 mg/dL).    Allergies  Allergen Reactions  . Benadryl [Diphenhydramine Hcl] Hives    Antimicrobials this admission: 4/10 >> Vancomycin >> 4/12 4/10 >> Zosyn >>    Microbiology results: 4/10 BCx: ngtd 4/10 UCx: > 100k Group B Strep  4/10 Sputum: nl flora 4/10 MRSA PCR: neg  Thank you for allowing pharmacy to be a part of this patient's care.  Juliette Alcide, PharmD, BCPS.   Pager: [phone] 04/28/2016 10:28 AM

## 2016-04-28 NOTE — Progress Notes (Signed)
Patient ID: Suzanne Hill, female   DOB: 1995/04/18, 21 y.o.   MRN: [id number]  PROGRESS NOTE    Suzanne Hill  XBJ:[id number] DOB: [birth year] DOA: 04/25/2016  PCP: Pcp Not In System   Brief Narrative:  21 year old female with past medical history of drug abuse. She presented to ED where she was found unresponsive outside of halfway house 04/25/2016. She was admitted for hearing overdose. She was combative and agitated in ED, hypoxic with oxygen saturation in 80s and lactic acid of 15.7. Chest x-ray showed bilateral airspace disease. Patient was intubated and under critical care medicine care through 04/27/2016. [facility] resume care 04/28/2016.  SIGNIFICANT EVENTS: 4/10 intubated after failing attempts at high flow 4/11 gas exchanged better. CXR improved. Weaning.   Assessment & Plan:   Active Problems:   Acute respiratory failure with hypoxia (HCC) / aspiration pneumonitis / Leukocytosis  - Hypoxia on the admission secondary to heroine overdose in addition to aspiration pneumonitis - Patient extubate 04/26/2016 - Respiratory status is stable - We will continue oxygen support via nasal cannula to keep oxygen saturation above 90% - Continue Zosyn - Blood cultures negative, respiratory cultures negative - Transfer to telemetry floor today    Septic shock due to aspiration / Lactic acidosis - Resolved - Off of pressor support - Blood cultures pending    Group B strep UTI - Continue Zosyn    Acute kidney injury - Secondary to sepsis - Resolved    Hypokalemia - Due to sepsis - Supplemented     Drug overdose / drug abuse - Counseled at bedside   DVT prophylaxis: SCD's bilaterally  Code Status: full code  Family Communication: boyfriend at the bedside  Disposition Plan: transfer to tele today    Consultants:   PCCM initially primary team, transferred to [facility] 4/13  Procedures:   Intubation 4/10 --> 4/11  Right IJ CVL 4/10  Antimicrobials:   Vanc 4/10>>>  4/12  Zosyn 4/10>>>   Subjective: No overnight events.   Objective: Vitals:   04/28/16 0730 04/28/16 0740 04/28/16 0800 04/28/16 0900  BP:   (!) 113/45 114/64  Pulse: 93 90 88 79  Resp: (!) 30 (!) 28 (!) 34 (!) 30  Temp:      TempSrc:      SpO2: 96% 100% 100% 100%  Weight:      Height:        Intake/Output Summary (Last 24 hours) at 04/28/16 0958 Last data filed at 04/28/16 0900  Gross per 24 hour  Intake              980 ml  Output             3050 ml  Net            -2070 ml   Filed Weights   04/26/16 0400 04/27/16 0500 04/28/16 0500  Weight: 52.5 kg (115 lb 11.9 oz) 52.5 kg (115 lb 11.9 oz) 52.4 kg (115 lb 8.3 oz)    Examination:  General exam: Appears calm and comfortable  Respiratory system: Clear to auscultation. Respiratory effort normal. Cardiovascular system: S1 & S2 heard, RRR. No JVD, murmurs, rubs, gallops or clicks. No pedal edema. Gastrointestinal system: Abdomen is nondistended, soft and nontender. No organomegaly or masses felt. Normal bowel sounds heard. Central nervous system: Alert and oriented. No focal neurological deficits. Extremities: Symmetric 5 x 5 power. Skin: No rashes, lesions or ulcers Psychiatry: Judgement and insight appear normal. Mood & affect appropriate.   Data Reviewed: I  have personally reviewed following labs and imaging studies  CBC:  Recent Labs Lab 04/25/16 1247 04/26/16 0428 04/27/16 0536 04/27/16 0957 04/28/16 0316  WBC 19.9* 21.3* 19.1*  --  20.8*  NEUTROABS 16.0*  --   --   --   --   HGB 15.0 10.0* 10.4*  --  11.1*  HCT 45.3 29.1* 29.8*  --  31.3*  MCV 95.6 93.6 92.8  --  90.7  PLT 343 183 145* 167 200   Basic Metabolic Panel:  Recent Labs Lab 04/25/16 1247 04/25/16 1629 04/25/16 1830 04/26/16 0428 04/27/16 0536 04/28/16 0316  NA 140 141  --  140 138 139  K 5.1 3.3*  --  3.7 3.8 3.1*  CL 104 113*  --  114* 104 100*  CO2 16* 24  --  GLUCOSE 223* 47*  --  75 136* 110*  BUN 17 21*  --  CREATININE 1.14* 0.74  --  0.77 0.61 0.62  CALCIUM 9.0 7.5*  --  7.1* 8.1* 8.6*  MG  --   --  1.7 1.2* 1.8 1.7  PHOS  --   --  2.9 2.9  --   --    GFR: Estimated Creatinine Clearance: 92 mL/min (by C-G formula based on SCr of 0.62 mg/dL). Liver Function Tests:  Recent Labs Lab 04/25/16 1247 04/25/16 1629 04/27/16 0536  AST 52* 55* 44*  ALT ALKPHOS 127* 68 57  BILITOT 1.0 0.7 0.9  PROT 7.6 5.5* 5.7*  ALBUMIN 4.5 3.4* 3.1*   No results for input(s): LIPASE, AMYLASE in the last 168 hours. No results for input(s): AMMONIA in the last 168 hours. Coagulation Profile:  Recent Labs Lab 04/25/16 1629 04/27/16 0957  INR 1.35 1.61   Cardiac Enzymes:  Recent Labs Lab 04/25/16 1830 04/25/16 2330 04/26/16 0428  TROPONINI 0.03* 0.04* 0.03*   BNP (last 3 results) No results for input(s): PROBNP in the last 8760 hours. HbA1C: No results for input(s): HGBA1C in the last 72 hours. CBG:  Recent Labs Lab 04/25/16 1811 04/25/16 1837  GLUCAP 39* 107*   Lipid Profile: No results for input(s): CHOL, HDL, LDLCALC, TRIG, CHOLHDL, LDLDIRECT in the last 72 hours. Thyroid Function Tests: No results for input(s): TSH, T4TOTAL, FREET4, T3FREE, THYROIDAB in the last 72 hours. Anemia Panel: No results for input(s): VITAMINB12, FOLATE, FERRITIN, TIBC, IRON, RETICCTPCT in the last 72 hours. Urine analysis:    Component Value Date/Time   COLORURINE YELLOW 04/25/2016 1339   APPEARANCEUR HAZY (A) 04/25/2016 1339   LABSPEC 1.013 04/25/2016 1339   PHURINE 5.0 04/25/2016 1339   GLUCOSEU >=500 (A) 04/25/2016 1339   HGBUR SMALL (A) 04/25/2016 1339   BILIRUBINUR NEGATIVE 04/25/2016 1339   KETONESUR 5 (A) 04/25/2016 1339   PROTEINUR 100 (A) 04/25/2016 1339   UROBILINOGEN 1.0 05/07/2013 1349   NITRITE NEGATIVE 04/25/2016 1339   LEUKOCYTESUR NEGATIVE 04/25/2016 1339   Sepsis Labs: (procalcitonin:4,lacticidven:4)   Recent Results (from the past 240 hour(s))    Blood Culture (routine x 2)     Status: None (Preliminary result)   Collection Time: 04/25/16 12:47 PM  Result Value Ref Range Status   Specimen Description BLOOD LEFT ANTECUBITAL  Final   Special Requests   Final    BOTTLES DRAWN AEROBIC AND ANAEROBIC Blood Culture adequate volume   Culture   Final    NO GROWTH 2 DAYS Performed at [facility], [address]  [place], [place] [address]    Report Status PENDING  Incomplete  Blood Culture (routine x 2)     Status: None (Preliminary result)   Collection Time: 04/25/16 12:56 PM  Result Value Ref Range Status   Specimen Description BLOOD RIGHT ANTECUBITAL  Final   Special Requests   Final    BOTTLES DRAWN AEROBIC ONLY Blood Culture adequate volume   Culture   Final    NO GROWTH 2 DAYS Performed at [facility], [address] [place], [place] [address]    Report Status PENDING  Incomplete  Urine culture     Status: Abnormal   Collection Time: 04/25/16  1:39 PM  Result Value Ref Range Status   Specimen Description URINE, CLEAN CATCH  Final   Special Requests NONE  Final   Culture (A)  Final    >=100,000 COLONIES/mL GROUP B STREP(S.AGALACTIAE)ISOLATED TESTING AGAINST S. AGALACTIAE NOT ROUTINELY PERFORMED DUE TO PREDICTABILITY OF AMP/PEN/VAN SUSCEPTIBILITY. Performed at [facility], [address] [place], [place] [address]    Report Status 04/26/2016 FINAL  Final  MRSA PCR Screening     Status: None   Collection Time: 04/25/16  3:56 PM  Result Value Ref Range Status   MRSA by PCR NEGATIVE NEGATIVE Final    Comment:        The GeneXpert MRSA Assay (FDA approved for NASAL specimens only), is one component of a comprehensive MRSA colonization surveillance program. It is not intended to diagnose MRSA infection nor to guide or monitor treatment for MRSA infections.   Culture, respiratory (NON-Expectorated)     Status: None   Collection Time: 04/25/16  5:41 PM  Result Value Ref Range Status   Specimen  Description TRACHEAL ASPIRATE  Final   Special Requests Normal  Final   Gram Stain   Final    ABUNDANT WBC PRESENT,BOTH PMN AND MONONUCLEAR NO ORGANISMS SEEN    Culture   Final    Consistent with normal respiratory flora. Performed at [facility], [address], [place], [place] [address]    Report Status 04/28/2016 FINAL  Final      Radiology Studies: Dg Abd 1 View Result Date: 04/25/2016 1. Enteric tube noted ending overlying the body of the stomach. 2. Diffuse bilateral airspace opacification again noted, which may reflect pulmonary edema or pneumonia. Aspiration cannot be excluded.   Dg Chest Port 1 View Result Date: 04/28/2016  Slightly improved bilateral lung opacities as described above.   Dg Chest Port 1 View Result Date: 04/27/2016 Mild hypoinflation. Persistent fluffy interstitial and alveolar opacities consistent with edema or pneumonia.   Dg Chest Port 1 View Result Date: 04/26/2016 1. Worsening bilateral fluffy airspace opacification raises concern for worsening pulmonary edema or pneumonia. 2. Mild cardiomegaly.  Dg Chest Port 1 View Result Date: 04/26/2016 1. Endotracheal tube seen ending 3 cm above the carina. 2. Diffuse bilateral airspace opacification is improved from the prior study. Vascular congestion. This may reflect improving pulmonary edema or pneumonia.   Dg Chest Port 1 View Result Date: 04/26/2016 1. Slightly lower lying endotracheal tube tip at 2.7 cm above the carina. Slight pullback is recommended 1 cm. 2. Gastric tube extends below the left hemidiaphragm into the expected location of the stomach. The tip is excluded on this study. 3. Right IJ central line catheter tip at the cavoatrial junction. No pneumothorax. 4. Right greater than left airspace disease persists though without significant worsening. Findings may represent pneumonia, aspiration or asymmetric pulmonary  edema.   Dg Chest Port 1 View Result Date: 04/25/2016 Endotracheal tube and  nasogastric tube well positioned. Better aeration of the lungs. Persistent airspace filling which could be a combination of edema, pneumonia and aspiration.   Dg Chest Port 1 View Result Date: 04/25/2016 Bilateral pulmonary interstitial and alveolar edema. Bilateral pneumonia is felt less likely.     Scheduled Meds: . benzonatate  200 mg Oral TID  . mouth rinse  15 mL Mouth Rinse BID  . nicotine  7 mg Transdermal Daily  . piperacillin-tazobactam (ZOSYN)  IV  3.375 g Intravenous Q8H   Continuous Infusions:   LOS: 3 days    Time spent: 25 minutes  Greater than 50% of the time spent on counseling and coordinating the care.   Manson Passey, MD [facility] Pager [phone]  If 7PM-7AM, please contact night-coverage [email or web address] Password [facility] 04/28/2016, 9:58 AM

## 2016-04-29 DIAGNOSIS — N390 Urinary tract infection, site not specified: Secondary | ICD-10-CM | POA: Diagnosis not present

## 2016-04-29 DIAGNOSIS — A419 Sepsis, unspecified organism: Secondary | ICD-10-CM

## 2016-04-29 DIAGNOSIS — R6521 Severe sepsis with septic shock: Secondary | ICD-10-CM

## 2016-04-29 DIAGNOSIS — J9601 Acute respiratory failure with hypoxia: Secondary | ICD-10-CM | POA: Diagnosis not present

## 2016-04-29 LAB — CBC
HCT: 30.9 % — ABNORMAL LOW (ref 36.0–46.0)
Hemoglobin: 10.6 g/dL — ABNORMAL LOW (ref 12.0–15.0)
MCH: 30.8 pg (ref 26.0–34.0)
MCHC: 34.3 g/dL (ref 30.0–36.0)
MCV: 89.8 fL (ref 78.0–100.0)
PLATELETS: 210 10*3/uL (ref 150–400)
RBC: 3.44 MIL/uL — ABNORMAL LOW (ref 3.87–5.11)
RDW: 12.5 % (ref 11.5–15.5)
WBC: 8.7 10*3/uL (ref 4.0–10.5)

## 2016-04-29 LAB — BASIC METABOLIC PANEL
Anion gap: 10 (ref 5–15)
BUN: 12 mg/dL (ref 6–20)
CALCIUM: 8.1 mg/dL — AB (ref 8.9–10.3)
CHLORIDE: 102 mmol/L (ref 101–111)
CO2: 27 mmol/L (ref 22–32)
CREATININE: 0.58 mg/dL (ref 0.44–1.00)
GFR calc Af Amer: >60 mL/min (ref >60)
Glucose, Bld: 93 mg/dL (ref 65–99)
Potassium: 3.2 mmol/L — ABNORMAL LOW (ref 3.5–5.1)
SODIUM: 139 mmol/L (ref 135–145)

## 2016-04-29 MED ORDER — GUAIFENESIN 100 MG/5ML PO SOLN
5.0000 mL | Freq: Four times a day (QID) | ORAL | Status: DC | PRN
  Administered 2016-04-29: 100 mg via ORAL
  Filled 2016-04-29: qty 10

## 2016-04-29 MED ORDER — POTASSIUM CHLORIDE CRYS ER 20 MEQ PO TBCR
40.0000 meq | EXTENDED_RELEASE_TABLET | Freq: Once | ORAL | Status: AC
  Administered 2016-04-29: 40 meq via ORAL
  Filled 2016-04-29: qty 2

## 2016-04-29 NOTE — Progress Notes (Addendum)
Patient ID: Suzanne Hill, female   DOB: 04-13-1995, 21 y.o.   MRN: [id number]  PROGRESS NOTE    [name]  NWG:[id number] DOB: [birth year] DOA: 04/25/2016  PCP: Pcp Not In System   Brief Narrative:  21 year old female with past medical history of drug abuse. She presented to ED where she was found unresponsive outside of halfway house 04/25/2016. She was admitted for hearing overdose. She was combative and agitated in ED, hypoxic with oxygen saturation in 80s and lactic acid of 15.7. Chest x-ray showed bilateral airspace disease. Patient was intubated and under critical care medicine care through 04/27/2016. [facility] resume care 04/28/2016.  SIGNIFICANT EVENTS: 4/10 intubated after failing attempts at high flow 4/11 gas exchanged better. CXR improved. Weaning.   Assessment & Plan:   Active Problems:   Acute respiratory failure with hypoxia (HCC) / aspiration pneumonitis / Leukocytosis  - Hypoxia on the admission secondary to heroine overdose in addition to aspiration pneumonitis - Patient extubated 04/26/2016 - Respiratory status remains stable  - Continue oxygen support via nasal cannula to keep oxygen saturation above 90% - Continue Zosyn - Blood cultures negative, respiratory cultures negative    Septic shock due to aspiration / Lactic acidosis - Resolved - Off of pressor support    Group B strep UTI - Continue Zosyn    Acute kidney injury - Secondary to sepsis - Resolved    Hypokalemia - Due to sepsis - Continue to supplement - Follow up BMP in am    Drug overdose / drug abuse - Counseled at bedside   DVT prophylaxis: SCD's bilaterally  Code Status: full code  Family Communication: boyfriend at the bedside  Disposition Plan: home likely by Monday    Consultants:   PCCM initially primary team, transferred to [facility] 4/13  Procedures:   Intubation 4/10 --> 4/11  Right IJ CVL 4/10  Antimicrobials:   Vanc 4/10>>> 4/12  Zosyn  4/10>>>   Subjective: No overnight events.   Objective: Vitals:   04/28/16 2126 04/29/16 0543 04/29/16 0624 04/29/16 1336  BP: 109/77 95/60  (!) 147/73  Pulse: 100 72  88  Resp: (!) Temp: 99 F (37.2 C) 99.6 F (37.6 C)  98.5 F (36.9 C)  TempSrc: Oral Oral  Oral  SpO2: 98% 100%  100%  Weight:   61.4 kg (135 lb 5.8 oz)   Height:        Intake/Output Summary (Last 24 hours) at 04/29/16 1427 Last data filed at 04/29/16 1309  Gross per 24 hour  Intake             1060 ml  Output                0 ml  Net             1060 ml   Filed Weights   04/27/16 0500 04/28/16 0500 04/29/16 0624  Weight: 52.5 kg (115 lb 11.9 oz) 52.4 kg (115 lb 8.3 oz) 61.4 kg (135 lb 5.8 oz)    Examination:  General exam: Appears calm and comfortable, no distress  Respiratory system: Diminished breath sounds, no wheezing  Cardiovascular system: S1 & S2 heard, Rate controlled  Gastrointestinal system: (+) BS, non tender  Central nervous system: No focal neurological deficits. Extremities: No swelling, palpable pulses Skin: No rashes, lesions or ulcers, skin is warm and dry Psychiatry: Normal mood and behavior    Data Reviewed: I have personally reviewed following labs and imaging studies  CBC:  Recent Labs Lab 04/25/16 1247 04/26/16 0428 04/27/16 0536 04/27/16 0957 04/28/16 0316 04/29/16 0551  WBC 19.9* 21.3* 19.1*  --  20.8* 8.7  NEUTROABS 16.0*  --   --   --   --   --   HGB 15.0 10.0* 10.4*  --  11.1* 10.6*  HCT 45.3 29.1* 29.8*  --  31.3* 30.9*  MCV 95.6 93.6 92.8  --  90.7 89.8  PLT 343 183 145* 167 200 210   Basic Metabolic Panel:  Recent Labs Lab 04/25/16 1629 04/25/16 1830 04/26/16 0428 04/27/16 0536 04/28/16 0316 04/29/16 0551  NA 141  --  140 138 139 139  K 3.3*  --  3.7 3.8 3.1* 3.2*  CL 113*  --  114* 104 100* 102  CO2 24  --  GLUCOSE 47*  --  75 136* 110* 93  BUN 21*  --  CREATININE 0.74  --  0.77 0.61 0.62 0.58  CALCIUM  7.5*  --  7.1* 8.1* 8.6* 8.1*  MG  --  1.7 1.2* 1.8 1.7  --   PHOS  --  2.9 2.9  --   --   --    GFR: Estimated Creatinine Clearance: 104.1 mL/min (by C-G formula based on SCr of 0.58 mg/dL). Liver Function Tests:  Recent Labs Lab 04/25/16 1247 04/25/16 1629 04/27/16 0536  AST 52* 55* 44*  ALT ALKPHOS 127* 68 57  BILITOT 1.0 0.7 0.9  PROT 7.6 5.5* 5.7*  ALBUMIN 4.5 3.4* 3.1*   No results for input(s): LIPASE, AMYLASE in the last 168 hours. No results for input(s): AMMONIA in the last 168 hours. Coagulation Profile:  Recent Labs Lab 04/25/16 1629 04/27/16 0957  INR 1.35 1.61   Cardiac Enzymes:  Recent Labs Lab 04/25/16 1830 04/25/16 2330 04/26/16 0428  TROPONINI 0.03* 0.04* 0.03*   BNP (last 3 results) No results for input(s): PROBNP in the last 8760 hours. HbA1C: No results for input(s): HGBA1C in the last 72 hours. CBG:  Recent Labs Lab 04/25/16 1811 04/25/16 1837  GLUCAP 39* 107*   Lipid Profile: No results for input(s): CHOL, HDL, LDLCALC, TRIG, CHOLHDL, LDLDIRECT in the last 72 hours. Thyroid Function Tests: No results for input(s): TSH, T4TOTAL, FREET4, T3FREE, THYROIDAB in the last 72 hours. Anemia Panel: No results for input(s): VITAMINB12, FOLATE, FERRITIN, TIBC, IRON, RETICCTPCT in the last 72 hours. Urine analysis:    Component Value Date/Time   COLORURINE YELLOW 04/25/2016 1339   APPEARANCEUR HAZY (A) 04/25/2016 1339   LABSPEC 1.013 04/25/2016 1339   PHURINE 5.0 04/25/2016 1339   GLUCOSEU >=500 (A) 04/25/2016 1339   HGBUR SMALL (A) 04/25/2016 1339   BILIRUBINUR NEGATIVE 04/25/2016 1339   KETONESUR 5 (A) 04/25/2016 1339   PROTEINUR 100 (A) 04/25/2016 1339   UROBILINOGEN 1.0 05/07/2013 1349   NITRITE NEGATIVE 04/25/2016 1339   LEUKOCYTESUR NEGATIVE 04/25/2016 1339   Sepsis Labs: (procalcitonin:4,lacticidven:4)   Recent Results (from the past 240 hour(s))  Blood Culture (routine x 2)     Status: None  (Preliminary result)   Collection Time: 04/25/16 12:47 PM  Result Value Ref Range Status   Specimen Description BLOOD LEFT ANTECUBITAL  Final   Special Requests   Final    BOTTLES DRAWN AEROBIC AND ANAEROBIC Blood Culture adequate volume   Culture   Final    NO GROWTH 4 DAYS Performed at [facility], [address]  [place], [place] [address]    Report Status PENDING  Incomplete  Blood Culture (routine x 2)     Status: None (Preliminary result)   Collection Time: 04/25/16 12:56 PM  Result Value Ref Range Status   Specimen Description BLOOD RIGHT ANTECUBITAL  Final   Special Requests   Final    BOTTLES DRAWN AEROBIC ONLY Blood Culture adequate volume   Culture   Final    NO GROWTH 4 DAYS Performed at [facility], [address], [place], [place] [address]    Report Status PENDING  Incomplete  Urine culture     Status: Abnormal   Collection Time: 04/25/16  1:39 PM  Result Value Ref Range Status   Specimen Description URINE, CLEAN CATCH  Final   Special Requests NONE  Final   Culture (A)  Final    >=100,000 COLONIES/mL GROUP B STREP(S.AGALACTIAE)ISOLATED TESTING AGAINST S. AGALACTIAE NOT ROUTINELY PERFORMED DUE TO PREDICTABILITY OF AMP/PEN/VAN SUSCEPTIBILITY. Performed at [facility], [address], [place], [place] [address]    Report Status 04/26/2016 FINAL  Final  MRSA PCR Screening     Status: None   Collection Time: 04/25/16  3:56 PM  Result Value Ref Range Status   MRSA by PCR NEGATIVE NEGATIVE Final    Comment:        The GeneXpert MRSA Assay (FDA approved for NASAL specimens only), is one component of a comprehensive MRSA colonization surveillance program. It is not intended to diagnose MRSA infection nor to guide or monitor treatment for MRSA infections.   Culture, respiratory (NON-Expectorated)     Status: None   Collection Time: 04/25/16  5:41 PM  Result Value Ref Range Status   Specimen Description TRACHEAL ASPIRATE  Final   Special  Requests Normal  Final   Gram Stain   Final    ABUNDANT WBC PRESENT,BOTH PMN AND MONONUCLEAR NO ORGANISMS SEEN    Culture   Final    Consistent with normal respiratory flora. Performed at [facility], [address], [place], [place] [address]    Report Status 04/28/2016 FINAL  Final      Radiology Studies: Dg Abd 1 View Result Date: 04/25/2016 1. Enteric tube noted ending overlying the body of the stomach. 2. Diffuse bilateral airspace opacification again noted, which may reflect pulmonary edema or pneumonia. Aspiration cannot be excluded.   Dg Chest Port 1 View Result Date: 04/28/2016  Slightly improved bilateral lung opacities as described above.   Dg Chest Port 1 View Result Date: 04/27/2016 Mild hypoinflation. Persistent fluffy interstitial and alveolar opacities consistent with edema or pneumonia.   Dg Chest Port 1 View Result Date: 04/26/2016 1. Worsening bilateral fluffy airspace opacification raises concern for worsening pulmonary edema or pneumonia. 2. Mild cardiomegaly.  Dg Chest Port 1 View Result Date: 04/26/2016 1. Endotracheal tube seen ending 3 cm above the carina. 2. Diffuse bilateral airspace opacification is improved from the prior study. Vascular congestion. This may reflect improving pulmonary edema or pneumonia.   Dg Chest Port 1 View Result Date: 04/26/2016 1. Slightly lower lying endotracheal tube tip at 2.7 cm above the carina. Slight pullback is recommended 1 cm. 2. Gastric tube extends below the left hemidiaphragm into the expected location of the stomach. The tip is excluded on this study. 3. Right IJ central line catheter tip at the cavoatrial junction. No pneumothorax. 4. Right greater than left airspace disease persists though without significant worsening. Findings may represent pneumonia, aspiration or asymmetric pulmonary  edema.   Dg Chest Port 1 View Result Date: 04/25/2016 Endotracheal tube and nasogastric tube well positioned. Better  aeration of the lungs. Persistent airspace filling which could be a combination of edema, pneumonia and aspiration.   Dg Chest Port 1 View Result Date: 04/25/2016 Bilateral pulmonary interstitial and alveolar edema. Bilateral pneumonia is felt less likely.     Scheduled Meds: . benzonatate  200 mg Oral TID  . mouth rinse  15 mL Mouth Rinse BID  . nicotine  7 mg Transdermal Daily  . piperacillin-tazobactam (ZOSYN)  IV  3.375 g Intravenous Q8H  . potassium chloride  40 mEq Oral Once   Continuous Infusions:   LOS: 4 days    Time spent: 15 minutes  Greater than 50% of the time spent on counseling and coordinating the care.   Manson Passey, MD [facility] Pager [phone]  If 7PM-7AM, please contact night-coverage [email or web address] Password [email or web address] 04/29/2016, 2:27 PM

## 2016-04-30 DIAGNOSIS — E872 Acidosis: Secondary | ICD-10-CM

## 2016-04-30 DIAGNOSIS — A419 Sepsis, unspecified organism: Secondary | ICD-10-CM | POA: Diagnosis not present

## 2016-04-30 DIAGNOSIS — R6521 Severe sepsis with septic shock: Secondary | ICD-10-CM | POA: Diagnosis not present

## 2016-04-30 DIAGNOSIS — J9601 Acute respiratory failure with hypoxia: Secondary | ICD-10-CM | POA: Diagnosis not present

## 2016-04-30 LAB — CULTURE, BLOOD (ROUTINE X 2)
CULTURE: NO GROWTH
Culture: NO GROWTH
Special Requests: ADEQUATE
Special Requests: ADEQUATE

## 2016-04-30 LAB — BASIC METABOLIC PANEL
ANION GAP: 8 (ref 5–15)
BUN: 11 mg/dL (ref 6–20)
CHLORIDE: 104 mmol/L (ref 101–111)
CO2: 27 mmol/L (ref 22–32)
Calcium: 8.6 mg/dL — ABNORMAL LOW (ref 8.9–10.3)
Creatinine, Ser: 0.55 mg/dL (ref 0.44–1.00)
GFR calc Af Amer: >60 mL/min (ref >60)
Glucose, Bld: 96 mg/dL (ref 65–99)
POTASSIUM: 3.5 mmol/L (ref 3.5–5.1)
Sodium: 139 mmol/L (ref 135–145)

## 2016-04-30 LAB — CBC
HEMATOCRIT: 33.5 % — AB (ref 36.0–46.0)
HEMOGLOBIN: 11.4 g/dL — AB (ref 12.0–15.0)
MCH: 31.3 pg (ref 26.0–34.0)
MCHC: 34 g/dL (ref 30.0–36.0)
MCV: 92 fL (ref 78.0–100.0)
PLATELETS: 232 10*3/uL (ref 150–400)
RBC: 3.64 MIL/uL — AB (ref 3.87–5.11)
RDW: 12.3 % (ref 11.5–15.5)
WBC: 7 10*3/uL (ref 4.0–10.5)

## 2016-04-30 MED ORDER — GUAIFENESIN 100 MG/5ML PO SOLN: 5.0000 mL | Freq: Four times a day (QID) | ORAL | 0 refills | Status: DC | PRN

## 2016-04-30 MED ORDER — ACETAMINOPHEN 325 MG PO TABS: 650.0000 mg | ORAL_TABLET | ORAL | 0 refills | Status: DC | PRN

## 2016-04-30 MED ORDER — LEVOFLOXACIN 750 MG PO TABS: 750.0000 mg | ORAL_TABLET | Freq: Every day | ORAL | 0 refills | Status: DC

## 2016-04-30 MED ORDER — BENZONATATE 200 MG PO CAPS: 200.0000 mg | ORAL_CAPSULE | Freq: Two times a day (BID) | ORAL | 0 refills | Status: DC | PRN

## 2016-04-30 NOTE — Discharge Summary (Addendum)
Physician Discharge Summary  Suzanne Hill ZOX:[id number] DOB: 10-27-1995 DOA: 04/25/2016  PCP: Pcp Not In System  Admit date: 04/25/2016 Discharge date: 04/30/2016  Recommendations for Outpatient Follow-up:   Take Levaquin for 7 days on discharge    Discharge Diagnoses:  Active Problems:   Acute respiratory failure (HCC)   Acute pulmonary edema (HCC)    Discharge Condition: stable   Diet recommendation: as tolerated   History of present illness:  21 year old female with past medical history of drug abuse. She presented to ED where she was found unresponsive outside of halfway house 04/25/2016. She was admitted for hearing overdose. She was combative and agitated in ED, hypoxic with oxygen saturation in 80s and lactic acid of 15.7. Chest x-ray showed bilateral airspace disease. Patient was intubated and under critical care medicine care through 04/27/2016. [facility] resume care 04/28/2016.  SIGNIFICANT EVENTS: 4/10 intubated after failing attempts at high flow 4/11 gas exchanged better. CXR improved.   Hospital Course:   Active Problems:   Acute pulmonary edema with acute respiratory failure with hypoxia (HCC) / aspiration pneumonitis / Leukocytosis  - Hypoxia on the admission secondary to heroine overdose in addition to aspiration pneumonitis - Patient extubated 04/26/2016 - Respiratory status remains stable  - O2 sat 97% without Weinert - Stop zosyn and use Levaquin for 7 days on discharge  - Blood cultures negative, respiratory cultures negative    Septic shock due to aspiration / Lactic acidosis - Resolved - Off of pressor support - Blood cultures with no growth     Group B strep UTI - Continue Zosyn until d/c and then levaquin as noted above for 7 days on discharge     Acute kidney injury - Secondary to sepsis - Resolved    Hypokalemia - Due to sepsis - Supplemented and WNL    Drug overdose / drug abuse - Counseled at bedside   DVT prophylaxis:  SCD's bilaterally  Code Status: full code  Family Communication: boyfriend at the bedside    Consultants:   PCCM initially primary team, transferred to [facility] 4/13  Procedures:   Intubation 4/10 --> 4/11  Right IJ CVL 4/10  Antimicrobials:   Vanc 4/10>>>4/12  Zosyn 4/10>>> 4/15   Signed:  Manson Passey, MD  [facility] 04/30/2016, 10:09 AM  Pager #: [phone]  Time spent in minutes: less than 30 minutes   Discharge Exam: Vitals:   04/29/16 2358 04/30/16 0614  BP:  97/60  Pulse:  79  Resp:  18  Temp: 99.8 F (37.7 C) 98.4 F (36.9 C)   Vitals:   04/29/16 2353 04/29/16 2358 04/30/16 0614 04/30/16 0920  BP: 99/63  97/60   Pulse:   79   Resp:   18   Temp:  99.8 F (37.7 C) 98.4 F (36.9 C)   TempSrc:  Oral Oral   SpO2:   100% 97%  Weight:      Height:        General: Pt is alert, follows commands appropriately, not in acute distress Cardiovascular: Regular rate and rhythm, S1/S2 +, no murmurs Respiratory: Clear to auscultation bilaterally, no wheezing, no crackles, no rhonchi Abdominal: Soft, non tender, non distended, bowel sounds +, no guarding Extremities: no edema, no cyanosis, pulses palpable bilaterally DP and PT Neuro: Grossly nonfocal  Discharge Instructions  Discharge Instructions    Call MD for:  persistant nausea and vomiting    Complete by:  As directed    Call MD for:  redness, tenderness, or signs  of infection (pain, swelling, redness, odor or green/yellow discharge around incision site)    Complete by:  As directed    Call MD for:  severe uncontrolled pain    Complete by:  As directed    Diet - low sodium heart healthy    Complete by:  As directed    Discharge instructions    Complete by:  As directed    Take Levaquin for 7 days on discharge   Increase activity slowly    Complete by:  As directed      Allergies as of 04/30/2016      Reactions   Benadryl [diphenhydramine Hcl] Hives      Medication List    TAKE  these medications   acetaminophen 325 MG tablet Commonly known as:  TYLENOL Take 2 tablets (650 mg total) by mouth every 4 (four) hours as needed for fever.   benzonatate 200 MG capsule Commonly known as:  TESSALON Take 1 capsule (200 mg total) by mouth 2 (two) times daily as needed for cough.   guaiFENesin 100 MG/5ML Soln Commonly known as:  ROBITUSSIN Take 5 mLs (100 mg total) by mouth every 6 (six) hours as needed for cough.   levofloxacin 750 MG tablet Commonly known as:  LEVAQUIN Take 1 tablet (750 mg total) by mouth daily.         The results of significant diagnostics from this hospitalization (including imaging, microbiology, ancillary and laboratory) are listed below for reference.    Significant Diagnostic Studies: Dg Abd 1 View  Result Date: 04/25/2016 CLINICAL DATA:  Orogastric tube placement.  Initial encounter. EXAM: ABDOMEN - 1 VIEW COMPARISON:  CT of the abdomen and pelvis from 05/07/2013 FINDINGS: The patient's enteric tube is noted ending overlying the body of the stomach. Diffuse bilateral airspace opacification is again noted, which may reflect pulmonary edema or pneumonia. Aspiration cannot be excluded. No acute osseous abnormalities are seen. The visualized bowel gas pattern is grossly unremarkable. IMPRESSION: 1. Enteric tube noted ending overlying the body of the stomach. 2. Diffuse bilateral airspace opacification again noted, which may reflect pulmonary edema or pneumonia. Aspiration cannot be excluded. Electronically Signed   By: Roanna Raider M.D.   On: 04/25/2016 21:09   Dg Chest Port 1 View  Result Date: 04/28/2016 CLINICAL DATA:  Respiratory failure. EXAM: PORTABLE CHEST 1 VIEW COMPARISON:  Radiograph of April 27, 2016. FINDINGS: The heart size and mediastinal contours are within normal limits. No pneumothorax or pleural effusion is noted. Bilateral alveolar opacities are again noted which are slightly improved. This is most consistent with pneumonia or  less likely edema. Bony thorax is unremarkable. The visualized skeletal structures are unremarkable. IMPRESSION: Slightly improved bilateral lung opacities as described above. Electronically Signed   By: Lupita Raider, M.D.   On: 04/28/2016 07:08   Dg Chest Port 1 View  Result Date: 04/27/2016 CLINICAL DATA:  Substance abuse, acute respiratory failure, pulmonary edema, pneumonia. EXAM: PORTABLE CHEST 1 VIEW COMPARISON:  Portable chest x-ray of November 26, 2016 FINDINGS: The lungs are less well inflated today. Fluffy alveolar opacities persist bilaterally that but are less dense. The cardiac silhouette is normal in size. The pulmonary vascularity is indistinct. The right internal jugular venous catheter tip projects over the cavoatrial junction. IMPRESSION: Mild hypoinflation. Persistent fluffy interstitial and alveolar opacities consistent with edema or pneumonia. Electronically Signed   By: David  [place] M.D.   On: 04/27/2016 07:02   Dg Chest Port 1 View  Result Date: 04/26/2016  CLINICAL DATA:  Acute onset of respiratory failure. Initial encounter. EXAM: PORTABLE CHEST 1 VIEW COMPARISON:  Chest radiograph performed earlier today at 4:24 a.m. FINDINGS: The lungs are well-aerated. Worsening fluffy bilateral airspace opacification raises concern for worsening pulmonary edema or pneumonia. No pleural effusion or pneumothorax is seen. The cardiomediastinal silhouette is mildly enlarged. No acute osseous abnormalities are identified. A right IJ line is noted ending about the cavoatrial junction. IMPRESSION: 1. Worsening bilateral fluffy airspace opacification raises concern for worsening pulmonary edema or pneumonia. 2. Mild cardiomegaly. Electronically Signed   By: Roanna Raider M.D.   On: 04/26/2016 22:43   Dg Chest Port 1 View  Result Date: 04/26/2016 CLINICAL DATA:  Endotracheal tube repositioning.  Initial encounter. EXAM: PORTABLE CHEST 1 VIEW COMPARISON:  Chest radiograph performed earlier today at  2:34 a.m. FINDINGS: The patient's endotracheal tube is seen ending 3 cm above the carina. An enteric tube is noted extending below the diaphragm. A right IJ line is noted ending about the cavoatrial junction. Diffuse bilateral airspace opacification has improved from the prior study. No pleural effusion or pneumothorax is seen. Vascular congestion is noted. The cardiomediastinal silhouette is normal in size. No acute osseous abnormalities are identified. IMPRESSION: 1. Endotracheal tube seen ending 3 cm above the carina. 2. Diffuse bilateral airspace opacification is improved from the prior study. Vascular congestion. This may reflect improving pulmonary edema or pneumonia. Electronically Signed   By: Roanna Raider M.D.   On: 04/26/2016 05:14   Dg Chest Port 1 View  Result Date: 04/26/2016 CLINICAL DATA:  Line placement EXAM: PORTABLE CHEST 1 VIEW COMPARISON:  04/25/2016 at 1700 hours FINDINGS: Right IJ central line catheter is noted with tip at the cavoatrial junction. No pneumothorax. Gastric tube extends below the left hemidiaphragm. Endotracheal tube is 2.7 cm above the carina slightly lower than prior and pullback 1 cm suggested. Patchy right greater than left airspace opacities are again noted without significant change. No acute nor suspicious osseous abnormalities. IMPRESSION: 1. Slightly lower lying endotracheal tube tip at 2.7 cm above the carina. Slight pullback is recommended 1 cm. 2. Gastric tube extends below the left hemidiaphragm into the expected location of the stomach. The tip is excluded on this study. 3. Right IJ central line catheter tip at the cavoatrial junction. No pneumothorax. 4. Right greater than left airspace disease persists though without significant worsening. Findings may represent pneumonia, aspiration or asymmetric pulmonary edema. Electronically Signed   By: Tollie Eth M.D.   On: 04/26/2016 03:08   Dg Chest Port 1 View  Result Date: 04/25/2016 CLINICAL DATA:   Respiratory failure.  Ventilator support. EXAM: PORTABLE CHEST 1 VIEW COMPARISON:  Earlier same day FINDINGS: Endotracheal tube tip 4 cm above the carina. Nasogastric tube enters the abdomen. Better lung inflation following intubation. Persistent bilateral airspace filling in a pattern that could represent a combination of edema, pneumonia and aspiration. IMPRESSION: Endotracheal tube and nasogastric tube well positioned. Better aeration of the lungs. Persistent airspace filling which could be a combination of edema, pneumonia and aspiration. Electronically Signed   By: Paulina Fusi M.D.   On: 04/25/2016 17:42   Dg Chest Port 1 View  Result Date: 04/25/2016 CLINICAL DATA:  Drug overdose EXAM: PORTABLE CHEST 1 VIEW COMPARISON:  PA and lateral chest x-ray of January 29, 2007 FINDINGS: There are fluffy alveolar opacities bilaterally. The pulmonary vascularity is indistinct. The cardiac silhouette is normal in size. There is no pleural effusion or pneumothorax. The mediastinum is normal in width.  The bony thorax exhibits no acute abnormality. IMPRESSION: Bilateral pulmonary interstitial and alveolar edema. Bilateral pneumonia is felt less likely. Electronically Signed   By: David  [place] M.D.   On: 04/25/2016 12:42    Microbiology: Recent Results (from the past 240 hour(s))  Blood Culture (routine x 2)     Status: None (Preliminary result)   Collection Time: 04/25/16 12:47 PM  Result Value Ref Range Status   Specimen Description BLOOD LEFT ANTECUBITAL  Final   Special Requests   Final    BOTTLES DRAWN AEROBIC AND ANAEROBIC Blood Culture adequate volume   Culture   Final    NO GROWTH 4 DAYS Performed at [facility], [address] [place], [place] [address]    Report Status PENDING  Incomplete  Blood Culture (routine x 2)     Status: None (Preliminary result)   Collection Time: 04/25/16 12:56 PM  Result Value Ref Range Status   Specimen Description BLOOD RIGHT ANTECUBITAL  Final   Special  Requests   Final    BOTTLES DRAWN AEROBIC ONLY Blood Culture adequate volume   Culture   Final    NO GROWTH 4 DAYS Performed at [facility], [address] [place], [place] [address]    Report Status PENDING  Incomplete  Urine culture     Status: Abnormal   Collection Time: 04/25/16  1:39 PM  Result Value Ref Range Status   Specimen Description URINE, CLEAN CATCH  Final   Special Requests NONE  Final   Culture (A)  Final    >=100,000 COLONIES/mL GROUP B STREP(S.AGALACTIAE)ISOLATED TESTING AGAINST S. AGALACTIAE NOT ROUTINELY PERFORMED DUE TO PREDICTABILITY OF AMP/PEN/VAN SUSCEPTIBILITY. Performed at [facility], [address], [place], [place] [address]    Report Status 04/26/2016 FINAL  Final  MRSA PCR Screening     Status: None   Collection Time: 04/25/16  3:56 PM  Result Value Ref Range Status   MRSA by PCR NEGATIVE NEGATIVE Final    Comment:        The GeneXpert MRSA Assay (FDA approved for NASAL specimens only), is one component of a comprehensive MRSA colonization surveillance program. It is not intended to diagnose MRSA infection nor to guide or monitor treatment for MRSA infections.   Culture, respiratory (NON-Expectorated)     Status: None   Collection Time: 04/25/16  5:41 PM  Result Value Ref Range Status   Specimen Description TRACHEAL ASPIRATE  Final   Special Requests Normal  Final   Gram Stain   Final    ABUNDANT WBC PRESENT,BOTH PMN AND MONONUCLEAR NO ORGANISMS SEEN    Culture   Final    Consistent with normal respiratory flora. Performed at [facility], [address], [place], [place] [address]    Report Status 04/28/2016 FINAL  Final     Labs: Basic Metabolic Panel:  Recent Labs Lab 04/25/16 1830 04/26/16 0428 04/27/16 0536 04/28/16 0316 04/29/16 0551 04/30/16 0543  NA  --  140 138 139 139 139  K  --  3.7 3.8 3.1* 3.2* 3.5  CL  --  114* 104 100* 102 104  CO2  --  GLUCOSE  --  75 136* 110* 93 96   BUN  --  CREATININE  --  0.77 0.61 0.62 0.58 0.55  CALCIUM  --  7.1* 8.1* 8.6* 8.1* 8.6*  MG 1.7 1.2* 1.8 1.7  --   --  PHOS 2.9 2.9  --   --   --   --    Liver Function Tests:  Recent Labs Lab 04/25/16 1247 04/25/16 1629 04/27/16 0536  AST 52* 55* 44*  ALT 17 17 21   ALKPHOS 127* 68 57  BILITOT 1.0 0.7 0.9  PROT 7.6 5.5* 5.7*  ALBUMIN 4.5 3.4* 3.1*   No results for input(s): LIPASE, AMYLASE in the last 168 hours. No results for input(s): AMMONIA in the last 168 hours. CBC:  Recent Labs Lab 04/25/16 1247 04/26/16 0428 04/27/16 0536 04/27/16 0957 04/28/16 0316 04/29/16 0551 04/30/16 0543  WBC 19.9* 21.3* 19.1*  --  20.8* 8.7 7.0  NEUTROABS 16.0*  --   --   --   --   --   --   HGB 15.0 10.0* 10.4*  --  11.1* 10.6* 11.4*  HCT 45.3 29.1* 29.8*  --  31.3* 30.9* 33.5*  MCV 95.6 93.6 92.8  --  90.7 89.8 92.0  PLT 343 183 145* 167 200 210 232   Cardiac Enzymes:  Recent Labs Lab 04/25/16 1830 04/25/16 2330 04/26/16 0428  TROPONINI 0.03* 0.04* 0.03*   BNP: BNP (last 3 results) No results for input(s): BNP in the last 8760 hours.  ProBNP (last 3 results) No results for input(s): PROBNP in the last 8760 hours.  CBG:  Recent Labs Lab 04/25/16 1811 04/25/16 1837  GLUCAP 39* 107*

## 2016-04-30 NOTE — Discharge Instructions (Signed)
Levofloxacin tablets What is this medicine? LEVOFLOXACIN (lee voe FLOX a sin) is a quinolone antibiotic. It is used to treat certain kinds of bacterial infections. It will not work for colds, flu, or other viral infections. This medicine may be used for other purposes; ask your health care provider or pharmacist if you have questions. COMMON BRAND NAME(S): Levaquin, Levaquin Leva-Pak What should I tell my health care provider before I take this medicine? They need to know if you have any of these conditions: -bone problems -diabetes -history of low levels of potassium in the blood -irregular heartbeat -joint problems -kidney disease -liver disease -myasthenia gravis -seizures -tendon problems -tingling of the fingers or toes, or other nerve disorder -an unusual or allergic reaction to levofloxacin, other quinolone antibiotics, foods, dyes, or preservatives -pregnant or trying to get pregnant -breast-feeding How should I use this medicine? Take this medicine by mouth with a full glass of water. Follow the directions on the prescription label. This medicine can be taken with or without food. Take your medicine at regular intervals. Do not take your medicine more often than directed. Do not skip doses or stop your medicine early even if you feel better. Do not stop taking except on your doctor's advice. A special MedGuide will be given to you by the pharmacist with each prescription and refill. Be sure to read this information carefully each time. Talk to your pediatrician regarding the use of this medicine in children. While this drug may be prescribed for children as young as 6 months for selected conditions, precautions do apply. Overdosage: If you think you have taken too much of this medicine contact a poison control center or emergency room at once. NOTE: This medicine is only for you. Do not share this medicine with others. What if I miss a dose? If you miss a dose, take it as soon as  you remember. If it is almost time for your next dose, take only that dose. Do not take double or extra doses. What may interact with this medicine? Do not take this medicine with any of the following medications: -bepridil -certain medicines for depression, anxiety, or psychotic disturbances like pimozide, thioridazine, and ziprasidone -certain medicines for irregular heart beat like dofetilide and dronedarone -cisapride -halofantrine This medicine may also interact with the following medications: -antacids -birth control pills -certain medicines for diabetes, like glipizide, glyburide, or insulin -didanosine buffered tablets or powder -multivitamins -NSAIDS, medicines for pain and inflammation, like ibuprofen or naproxen -steroid medicines like prednisone or cortisone -sucralfate -theophylline -warfarin This list may not describe all possible interactions. Give your health care provider a list of all the medicines, herbs, non-prescription drugs, or dietary supplements you use. Also tell them if you smoke, drink alcohol, or use illegal drugs. Some items may interact with your medicine. What should I watch for while using this medicine? Tell your doctor or healthcare professional if your symptoms do not start to get better or if they get worse. Do not treat diarrhea with over the counter products. Contact your doctor if you have diarrhea that lasts more than 2 days or if it is severe and watery. Check with your doctor or health care professional if you get an attack of severe diarrhea, nausea and vomiting, or if you sweat a lot. The loss of too much body fluid can make it dangerous for you to take this medicine. You may get drowsy or dizzy. Do not drive, use machinery, or do anything that needs mental alertness until you   know how this medicine affects you. Do not sit or stand up quickly, especially if you are an older patient. This reduces the risk of dizzy or fainting spells. This medicine  can make you more sensitive to the sun. Keep out of the sun. If you cannot avoid being in the sun, wear protective clothing and use a sunscreen. Do not use sun lamps or tanning beds/booths. Contact your doctor if you get a sunburn. If you are a diabetic monitor your blood glucose carefully. If you get an unusual reading stop taking this medicine and call your doctor right away. Avoid antacids, calcium, iron, and zinc products for 2 hours before and 2 hours after taking a dose of this medicine. What side effects may I notice from receiving this medicine? Side effects that you should report to your doctor or health care professional as soon as possible: -allergic reactions like skin rash or hives, swelling of the face, lips, or tongue -anxious -breathing problems -confusion -depressed mood -diarrhea -dizziness -fast, irregular heartbeat -hallucination, loss of contact with reality -joint, muscle, or tendon pain or swelling -muscle weakness -pain, tingling, numbness in the hands or feet -seizures -signs and symptoms of high blood sugar such as dizziness; dry mouth; dry skin; fruity breath; nausea; stomach pain; increased hunger or thirst; increased urination -signs and symptoms of liver injury like dark yellow or brown urine; general ill feeling or flu-like symptoms; light-colored stools; loss of appetite; nausea; right upper belly pain; unusually weak or tired; yellowing of the eyes or skin -signs and symptoms of low blood sugar such as feeling anxious; confusion; dizziness; increased hunger; unusually weak or tired; sweating; shakiness; cold; irritable; headache; blurred vision; fast heartbeat; loss of consciousness -suicidal thoughts or other mood changes -sunburn -unusually weak or tired Side effects that usually do not require medical attention (report to your doctor or health care professional if they continue or are bothersome): -constipation -dry mouth -headache -nausea,  vomiting -trouble sleeping This list may not describe all possible side effects. Call your doctor for medical advice about side effects. You may report side effects to FDA at 1-800-FDA-1088. Where should I keep my medicine? Keep out of the reach of children. Store at room temperature between 15 and 30 degrees C (59 and 86 degrees F). Keep in a tightly closed container. Throw away any unused medicine after the expiration date. NOTE: This sheet is a summary. It may not cover all possible information. If you have questions about this medicine, talk to your doctor, pharmacist, or health care provider.  2018 Elsevier/Gold Standard (2015-07-13 12:38:27)  

## 2016-10-31 LAB — BLOOD GAS, ARTERIAL
Acid-base deficit: 3 mmol/L — ABNORMAL HIGH (ref 0.0–2.0)
BICARBONATE: 22.9 mmol/L (ref 20.0–28.0)
Drawn by: 295031
FIO2: 100
LHR: 24 {breaths}/min
MECHVT: 400 mL
O2 Saturation: 99.4 %
PATIENT TEMPERATURE: 98.6
PCO2 ART: 46.7 mmHg (ref 32.0–48.0)
PEEP/CPAP: 10 cmH2O
PH ART: 7.311 — AB (ref 7.350–7.450)
PO2 ART: 246 mmHg — AB (ref 83.0–108.0)

## 2017-06-12 ENCOUNTER — Other Ambulatory Visit: Payer: Self-pay

## 2017-06-12 ENCOUNTER — Emergency Department (HOSPITAL_COMMUNITY): Payer: Self-pay

## 2017-06-12 ENCOUNTER — Emergency Department (HOSPITAL_COMMUNITY)
Admission: EM | Admit: 2017-06-12 | Discharge: 2017-06-13 | Disposition: A | Discharge status: Home / Self Care | Payer: Self-pay | Attending: Emergency Medicine | Admitting: Emergency Medicine

## 2017-06-12 ENCOUNTER — Encounter (HOSPITAL_COMMUNITY): Payer: Self-pay

## 2017-06-12 DIAGNOSIS — Y939 Activity, unspecified: Secondary | ICD-10-CM | POA: Insufficient documentation

## 2017-06-12 DIAGNOSIS — Y999 Unspecified external cause status: Secondary | ICD-10-CM | POA: Insufficient documentation

## 2017-06-12 DIAGNOSIS — S61211A Laceration without foreign body of left index finger without damage to nail, initial encounter: Secondary | ICD-10-CM | POA: Insufficient documentation

## 2017-06-12 DIAGNOSIS — Y929 Unspecified place or not applicable: Secondary | ICD-10-CM | POA: Insufficient documentation

## 2017-06-12 DIAGNOSIS — W260XXA Contact with knife, initial encounter: Secondary | ICD-10-CM | POA: Insufficient documentation

## 2017-06-12 DIAGNOSIS — Z23 Encounter for immunization: Secondary | ICD-10-CM | POA: Insufficient documentation

## 2017-06-12 DIAGNOSIS — F17210 Nicotine dependence, cigarettes, uncomplicated: Secondary | ICD-10-CM | POA: Insufficient documentation

## 2017-06-12 DIAGNOSIS — Z79899 Other long term (current) drug therapy: Secondary | ICD-10-CM | POA: Insufficient documentation

## 2017-06-12 DIAGNOSIS — M25532 Pain in left wrist: Secondary | ICD-10-CM

## 2017-06-12 MED ORDER — NAPROXEN 500 MG PO TABS: 500.0000 mg | ORAL_TABLET | Freq: Two times a day (BID) | ORAL | 0 refills | Status: DC

## 2017-06-12 MED ORDER — TETANUS-DIPHTH-ACELL PERTUSSIS 5-2.5-18.5 LF-MCG/0.5 IM SUSP
0.5000 mL | Freq: Once | INTRAMUSCULAR | Status: AC
  Administered 2017-06-12: 0.5 mL via INTRAMUSCULAR
  Filled 2017-06-12: qty 0.5

## 2017-06-12 MED ORDER — LIDOCAINE HCL (PF) 1 % IJ SOLN
5.0000 mL | Freq: Once | INTRAMUSCULAR | Status: AC
  Administered 2017-06-12: 5 mL
  Filled 2017-06-12: qty 5

## 2017-06-12 MED ORDER — NAPROXEN 250 MG PO TABS
500.0000 mg | ORAL_TABLET | Freq: Once | ORAL | Status: AC
  Administered 2017-06-12: 500 mg via ORAL
  Filled 2017-06-12: qty 2

## 2017-06-12 NOTE — ED Provider Notes (Signed)
[facility] EMERGENCY DEPARTMENT Provider Note   CSN: [id number] Arrival date & time: 06/12/17  1758     History   Chief Complaint Chief Complaint  Patient presents with  . Laceration    HPI Suzanne Hill is a 22 y.o. female who presents to the ED with complaints of L wrist x 2 days and L 2nd digit laceration which occurred just prior to noon today. Patient states that she has had problems with L wrist in the past- she states this occurs when she drives excessively- this has been occurring recently. Wrist pain to dorsal radial region- worse with movement. No traumatic injuries to wrist. Patient states she cut the left 2nd finger with a knife today. Unknown last tetanus. Denies fever, chills, change in color, numbness, or weakness.   HPI  Past Medical History:  Diagnosis Date  . Substance abuse [facility])     Patient Active Problem List   Diagnosis Date Noted  . Acute respiratory failure (HCC) 04/25/2016  . Accidental overdose of heroin (HCC)   . Septic shock (HCC)   . Acute pulmonary edema (HCC)   . Chlamydia 05/08/2013  . Abdominal pain 05/08/2013    Past Surgical History:  Procedure Laterality Date  . WISDOM TOOTH EXTRACTION       OB History   None      Home Medications    Prior to Admission medications   Medication Sig Start Date End Date Taking? Authorizing Provider  acetaminophen (TYLENOL) 325 MG tablet Take 2 tablets (650 mg total) by mouth every 4 (four) hours as needed for fever. 04/30/16   Alison Murray, MD  benzonatate (TESSALON) 200 MG capsule Take 1 capsule (200 mg total) by mouth 2 (two) times daily as needed for cough. 04/30/16   Alison Murray, MD  guaiFENesin (ROBITUSSIN) 100 MG/5ML SOLN Take 5 mLs (100 mg total) by mouth every 6 (six) hours as needed for cough. 04/30/16   Alison Murray, MD  levofloxacin (LEVAQUIN) 750 MG tablet Take 1 tablet (750 mg total) by mouth daily. 04/30/16   Alison Murray, MD    Family History History  reviewed. No pertinent family history.  Social History Social History   Tobacco Use  . Smoking status: Current Every Day Smoker  . Smokeless tobacco: Never Used  Substance Use Topics  . Alcohol use: Yes  . Drug use: Yes    Types: Marijuana     Allergies   Benadryl [diphenhydramine hcl]   Review of Systems Review of Systems  Constitutional: Negative for chills and fever.  Musculoskeletal: Positive for arthralgias (L wrist).  Skin: Positive for wound (L 2nd digit).  Neurological: Negative for weakness and numbness.    Physical Exam Updated Vital Signs Ht  (1.651 m)   Wt 53.5 kg (118 lb)   LMP 05/29/2017   BMI 19.64 kg/m   Physical Exam  Constitutional: She appears well-developed and well-nourished. No distress.  HENT:  Head: Normocephalic and atraumatic.  Eyes: Conjunctivae are normal. Right eye exhibits no discharge. Left eye exhibits no discharge.  Musculoskeletal:  No obvious deformity, appreciable swelling, erythema, ecchymosis, or overlying warmth. Patient has full ROM to bilateral elbows. She has full flexion in the L wrist with some limitation in L wrist extension secondary to pain. She is able to pronate/supinate. Full ROM of all digits with the exception of the L 2nd finger DIP/PIP- she is able to fully extend, able to minimally flex but cannot perform full flexion secondary  to pain. She is minimally tender over her laceration. She is tender over the dorsoradial aspect of the wrist as well as the 1st metacarpal region. Pain significant worse with ulnar deviation- + Finkelstein maneuver.   Neurological: She is alert.  Clear speech. Sensation grossly intact to bilateral upper extremities. Grip strength difficult to assess secondary to pain. Able to perform OK, thumbs up, and cross 2nd/3rd digits.   Skin: Skin is warm and dry. Capillary refill takes less than 2 seconds.  LUE: 1 cm linear laceration to the palmar aspect of the left 2nd distal phalanx. No  appreciable foreign body. No active bleeding.   Psychiatric: She has a normal mood and affect. Her behavior is normal. Thought content normal.  Nursing note and vitals reviewed.    ED Treatments / Results  Labs (all labs ordered are listed, but only abnormal results are displayed) Labs Reviewed - No data to display  EKG None  Radiology Dg Wrist Complete Left  Result Date: 06/12/2017 CLINICAL DATA:  22 year old female with left lateral wrist pain for 3-4 days with no known injury. EXAM: LEFT WRIST - COMPLETE 3+ VIEW COMPARISON:  None. FINDINGS: Bone mineralization is within normal limits. There is no evidence of fracture or dislocation. There is no evidence of arthropathy or other focal bone abnormality. Soft tissues are unremarkable. IMPRESSION: Negative. Electronically Signed   By: Odessa Fleming M.D.   On: 06/12/2017 19:57    Procedures .Marland KitchenLaceration Repair Date/Time: 06/12/2017 11:50 PM Performed by: Cherly Anderson, PA-C Authorized by: Cherly Anderson, PA-C   Consent:    Consent obtained:  Verbal   Consent given by:  Patient   Risks discussed:  Infection, pain, retained foreign body, tendon damage, poor cosmetic result, need for additional repair, nerve damage, poor wound healing and vascular damage   Alternatives discussed:  No treatment Anesthesia (see MAR for exact dosages):    Anesthesia method:  Local infiltration and nerve block   Local anesthetic:  Lidocaine 1% w/o epi   Block anesthetic:  Lidocaine 1% w/o epi   Block technique:  Digital   Block outcome:  Incomplete block Laceration details:    Location:  Finger   Finger location:  L index finger   Length (cm):  1 Repair type:    Repair type:  Simple Pre-procedure details:    Preparation:  Patient was prepped and draped in usual sterile fashion Exploration:    Hemostasis achieved with:  Direct pressure   Wound exploration: wound explored through full range of motion and entire depth of wound probed and  visualized     Contaminated: no   Treatment:    Area cleansed with:  Betadine   Amount of cleaning:  Standard   Irrigation solution:  Sterile water   Irrigation volume:  1L   Irrigation method:  Pressure wash Skin repair:    Repair method:  Sutures   Suture size:  4-0   Wound skin closure material used: Vicryl Rapide.   Suture technique:  Simple interrupted   Number of sutures:  3 Approximation:    Approximation:  Close Post-procedure details:    Dressing:  Antibiotic ointment and non-adherent dressing   Patient tolerance of procedure:  Tolerated well, no immediate complications   (including critical care time)  Medications Ordered in ED Medications  lidocaine (PF) (XYLOCAINE) 1 % injection 5 mL (has no administration in time range)  Tdap (BOOSTRIX) injection 0.5 mL (has no administration in time range)  naproxen (NAPROSYN) tablet 500 mg (  has no administration in time range)   Initial Impression / Assessment and Plan / ED Course  I have reviewed the triage vital signs and the nursing notes.  Pertinent labs & imaging results that were available during my care of the patient were reviewed by me and considered in my medical decision making (see chart for details).   Patient presents with L wrist pain x 2 days and L 2nd finger laceration.  - Wrist pain- No overlying erythema/warmth, patient is afebrile, no systemic sxs doubt infectious etiology. X-ray per triage negative for fracture/dislocation. NVI. Suspicion for DeQuervain's tenosynovitis based on exam. Will place in thumb spica splint and give prescription for naproxen. Follow up with PCP or hand surgery if no improvement.  - Laceration- Pressure irrigation performed. Wound explored and base of wound visualized in a bloodless field without evidence of foreign body.  Repair per procedure note above- absorbable sutures. Tdap updated. Discussed suture home care with patient and answered questions. PCP follow up, return sooner for  signs of infection.  I discussed results, treatment plan, need for follow-up, and return precautions with the patient. Provided opportunity for questions, patient confirmed understanding and is in agreement with plan.   Final Clinical Impressions(s) / ED Diagnoses   Final diagnoses:  Laceration of left index finger without foreign body without damage to nail, initial encounter  Left wrist pain    ED Discharge Orders        Ordered    naproxen (NAPROSYN) 500 MG tablet  2 times daily     06/12/17 [address] [place] R, PA-C 06/13/17 0120    Tegeler, Canary Brim, MD 06/13/17 [id number]

## 2017-06-12 NOTE — Discharge Instructions (Addendum)
You were seen in the emergency department today for a laceration and for pain in your left wrist.  Regarding the laceration 3 stitches were put in, these are absorbable, do not have these removed.  Do not get the wound wet for 24 hours, after 24 hours do not soak the wound until the stitches have been absorbed.  You may apply antibiotic ointment to this area and wear a Band-Aid.  Return to the ER regarding the laceration for signs of infection including but not limited to fever, drainage from the wound, spreading redness, increased pain, increased swelling, or any other concerns.  Regarding your wrist pain, your x-ray was negative, we have placed you in a thumb spica splint, please wear this for comfort-reviewed visit if your fingers started to improve and or are numb or tingling, also come to the ER for this.  Apply ice 20 minutes on 40 minutes off the next 24 to 48 hours.  Do not apply ice directly to the skin, place in a towel.  Appears to have a discomfort in 1 week please follow-up with orthopedic surgeon/hand surgeon Dr. Amanda Pea, his phone number in your discharge instructions.  Return to the ER for new or worsening symptoms or any other concerns.

## 2017-06-12 NOTE — ED Triage Notes (Signed)
Pt c/o pain in left wrist X2-3 days, cut left index finger today with knife.

## 2019-07-17 ENCOUNTER — Encounter (HOSPITAL_COMMUNITY): Payer: Self-pay | Admitting: Emergency Medicine

## 2019-07-17 ENCOUNTER — Emergency Department (HOSPITAL_COMMUNITY)
Admission: EM | Admit: 2019-07-17 | Discharge: 2019-07-17 | Disposition: A | Discharge status: Home / Self Care | Payer: Self-pay | Attending: Emergency Medicine | Admitting: Emergency Medicine

## 2019-07-17 DIAGNOSIS — F17200 Nicotine dependence, unspecified, uncomplicated: Secondary | ICD-10-CM | POA: Insufficient documentation

## 2019-07-17 DIAGNOSIS — K0889 Other specified disorders of teeth and supporting structures: Secondary | ICD-10-CM | POA: Insufficient documentation

## 2019-07-17 MED ORDER — PENICILLIN V POTASSIUM 500 MG PO TABS: 500.0000 mg | ORAL_TABLET | Freq: Three times a day (TID) | ORAL | 0 refills | Status: AC

## 2019-07-17 MED ORDER — NAPROXEN 500 MG PO TABS: 500.0000 mg | ORAL_TABLET | Freq: Two times a day (BID) | ORAL | 0 refills | Status: DC

## 2019-07-17 NOTE — ED Triage Notes (Signed)
Pt here from home with c/o dental pain that has been ongoing for a  Few days

## 2019-07-17 NOTE — ED Notes (Signed)
Provider at bedside

## 2019-07-17 NOTE — ED Notes (Signed)
Pt verbalized understanding of d/c instructions, medications and follow up and well as dental resource guide. No questions at this time. Pt ambulatory to WR with steady gait. NAD

## 2019-07-17 NOTE — Discharge Instructions (Signed)
Take the medication as prescribed  Follow-up with dentistry  Return for any worsening symptoms

## 2019-07-17 NOTE — ED Provider Notes (Signed)
[facility] EMERGENCY DEPARTMENT Provider Note   CSN: [id number] Arrival date & time: 07/17/19  1327    History Dental Pain  Suzanne Hill is a 24 y.o. female with past medical history significant for polysubstance use, accidental overdose who presents for evaluation of dental pain.  Patient states she has had dental pain for "a long time."  This is due to a fractured tooth to her right upper dentition.  Patient states pain has worsened over the last 2 days.  She has noted some very minimal swelling to the right side of her face.  She has not take anything for her pain.  She rates her pain a 6/10.  She has been able to tolerate p.o. intake at home without difficulty.  Is not followed by dentistry.  Denies fever, chills, nausea, vomiting, drooling, dysphagia, trismus, voice changes, shortness of breath, cough, neck pain, neck stiffness.  Denies aggravating or alleviating factors.  No rashes or lesions.  History obtained from patient and past medical records.  No interpreter is used.  HPI     Past Medical History:  Diagnosis Date  . Substance abuse [facility])     Patient Active Problem List   Diagnosis Date Noted  . Acute respiratory failure (HCC) 04/25/2016  . Accidental overdose of heroin (HCC)   . Septic shock (HCC)   . Acute pulmonary edema (HCC)   . Chlamydia 05/08/2013  . Abdominal pain 05/08/2013    Past Surgical History:  Procedure Laterality Date  . WISDOM TOOTH EXTRACTION       OB History   No obstetric history on file.     History reviewed. No pertinent family history.  Social History   Tobacco Use  . Smoking status: Current Every Day Smoker  . Smokeless tobacco: Never Used  Substance Use Topics  . Alcohol use: Yes  . Drug use: Yes    Types: Marijuana    Home Medications Prior to Admission medications   Medication Sig Start Date End Date Taking? Authorizing Provider  acetaminophen (TYLENOL) 325 MG tablet Take 2 tablets (650 mg total) by  mouth every 4 (four) hours as needed for fever. 04/30/16   Alison Murray, MD  benzonatate (TESSALON) 200 MG capsule Take 1 capsule (200 mg total) by mouth 2 (two) times daily as needed for cough. 04/30/16   Alison Murray, MD  guaiFENesin (ROBITUSSIN) 100 MG/5ML SOLN Take 5 mLs (100 mg total) by mouth every 6 (six) hours as needed for cough. 04/30/16   Alison Murray, MD  levofloxacin (LEVAQUIN) 750 MG tablet Take 1 tablet (750 mg total) by mouth daily. 04/30/16   Alison Murray, MD  naproxen (NAPROSYN) 500 MG tablet Take 1 tablet (500 mg total) by mouth 2 (two) times daily. 07/17/19   Jaiyah Beining A, PA-C  penicillin v potassium (VEETID) 500 MG tablet Take 1 tablet (500 mg total) by mouth 3 (three) times daily for 7 days. 07/17/19 07/24/19  Teigan Sahli A, PA-C    Allergies    Benadryl [diphenhydramine hcl]  Review of Systems   Review of Systems  Constitutional: Negative.   HENT: Positive for dental problem and facial swelling. Negative for congestion, ear discharge, ear pain, postnasal drip, rhinorrhea, sinus pressure, sinus pain, sneezing, sore throat, trouble swallowing and voice change.   Eyes: Negative.   Respiratory: Negative.   Gastrointestinal: Negative.   Musculoskeletal: Negative.   Skin: Negative.   All other systems reviewed and are negative.   Physical Exam Updated  Vital Signs BP 115/81 (BP Location: Left Arm)   Pulse 75   Temp 98.4 F (36.9 C) (Oral)   Resp 15   SpO2 100%   Physical Exam Vitals and nursing note reviewed.  Constitutional:      General: She is not in acute distress.    Appearance: She is well-developed. She is not ill-appearing, toxic-appearing or diaphoretic.  HENT:     Head: Normocephalic and atraumatic.     Jaw: There is normal jaw occlusion.     Comments: Very minimal right-sided facial swelling over right maxilla.  No erythema or warmth.  No fluctuance or induration.    Mouth/Throat:     Lips: Pink.     Mouth: Mucous membranes are moist.      Dentition: Abnormal dentition. Does not have dentures. Dental tenderness and dental caries present. No gingival swelling or dental abscesses.     Tongue: No lesions. Tongue does not deviate from midline.     Palate: No lesions.     Pharynx: Oropharynx is clear. Uvula midline.     Tonsils: 0 on the right. 0 on the left.      Comments: Poor dentition with multiple missing teeth and dental caries.  There is fractured teeth.  Tenderness to right upper posterior dentition near molars.  Mild gingival erythema without drainable periapical abscess.  Sublingual area soft.  Tongue midline.  No drooling, dysphasia or trismus. Eyes:     Pupils: Pupils are equal, round, and reactive to light.  Neck:     Trachea: Trachea and phonation normal.     Comments: No neck stiffness or neck rigidity.  No meningismus Cardiovascular:     Rate and Rhythm: Normal rate.     Pulses: Normal pulses.          Radial pulses are 2+ on the right side and 2+ on the left side.     Heart sounds: Normal heart sounds.  Pulmonary:     Effort: Pulmonary effort is normal. No respiratory distress.     Breath sounds: Normal breath sounds and air entry.  Abdominal:     General: There is no distension.  Musculoskeletal:        General: Normal range of motion.     Cervical back: Full passive range of motion without pain and normal range of motion.  Lymphadenopathy:     Cervical: No cervical adenopathy.  Skin:    General: Skin is warm and dry.     Capillary Refill: Capillary refill takes less than 2 seconds.     Comments: No edema, erythema or warmth.  No fluctuance or induration.  No rashes or lesions  Neurological:     Mental Status: She is alert.     Gait: Gait is intact.     Comments: Cranial nerves II through XII grossly intact    ED Results / Procedures / Treatments   Labs (all labs ordered are listed, but only abnormal results are displayed) Labs Reviewed - No data to display  EKG None  Radiology No results  found.  Procedures Procedures (including critical care time)  Medications Ordered in ED Medications - No data to display  ED Course  I have reviewed the triage vital signs and the nursing notes.  Pertinent labs & imaging results that were available during my care of the patient were reviewed by me and considered in my medical decision making (see chart for details).  24 year old female presents for evaluation of dental pain.  Has had  longstanding dental pain however acutely worsened over the last 2 days.  She is afebrile, nonseptic, not ill-appearing.  She has very minimal swelling over her right maxilla.  She has overall poor dentition with multiple missing teeth and fractured teeth.  She has some gingival erythema to her right upper dentition over molars however no drainable periapical abscess. No drooling, dysphasia or trismus.  Low suspicion for deep space infection or Ludwig's angina. Her airway wi without stridor and is tolerating p.o. intake actively in room.  Patient is requesting opiate pain medicine however I do not feel this is best given her history of polysubstance use.  Feel anti-inflammatories and antibiotics would be best in this situation.  She was referred outpatient to dentistry.  No drainable abscess at this time.  The patient has been appropriately medically screened and/or stabilized in the ED. I have low suspicion for any other emergent medical condition which would require further screening, evaluation or treatment in the ED or require inpatient management.  Patient is hemodynamically stable and in no acute distress.  Patient able to ambulate in department prior to ED.  Evaluation does not show acute pathology that would require ongoing or additional emergent interventions while in the emergency department or further inpatient treatment.  I have discussed the diagnosis with the patient and answered all questions.  Pain is been managed while in the emergency department and  patient has no further complaints prior to discharge.  Patient is comfortable with plan discussed in room and is stable for discharge at this time.  I have discussed strict return precautions for returning to the emergency department.  Patient was encouraged to follow-up with PCP/specialist refer to at discharge.    MDM Rules/Calculators/A&P                           Final Clinical Impression(s) / ED Diagnoses Final diagnoses:  Pain, dental    Rx / DC Orders ED Discharge Orders         Ordered    penicillin v potassium (VEETID) 500 MG tablet  3 times daily     Discontinue  Reprint     07/17/19 1655    naproxen (NAPROSYN) 500 MG tablet  2 times daily     Discontinue  Reprint     07/17/19 1655           Andrey Hoobler A, PA-C 07/17/19 1702    Terrilee Files, MD 07/18/19 1244

## 2019-09-16 IMAGING — CR DG WRIST COMPLETE 3+V*L*
4 series · 4 of 4 positions shown · non-contrast
Comparison: None.

CLINICAL DATA: 22-year-old female with left lateral wrist pain for
3-4 days with no known injury.

EXAM:
LEFT WRIST - COMPLETE 3+ VIEW

[wrist pa]
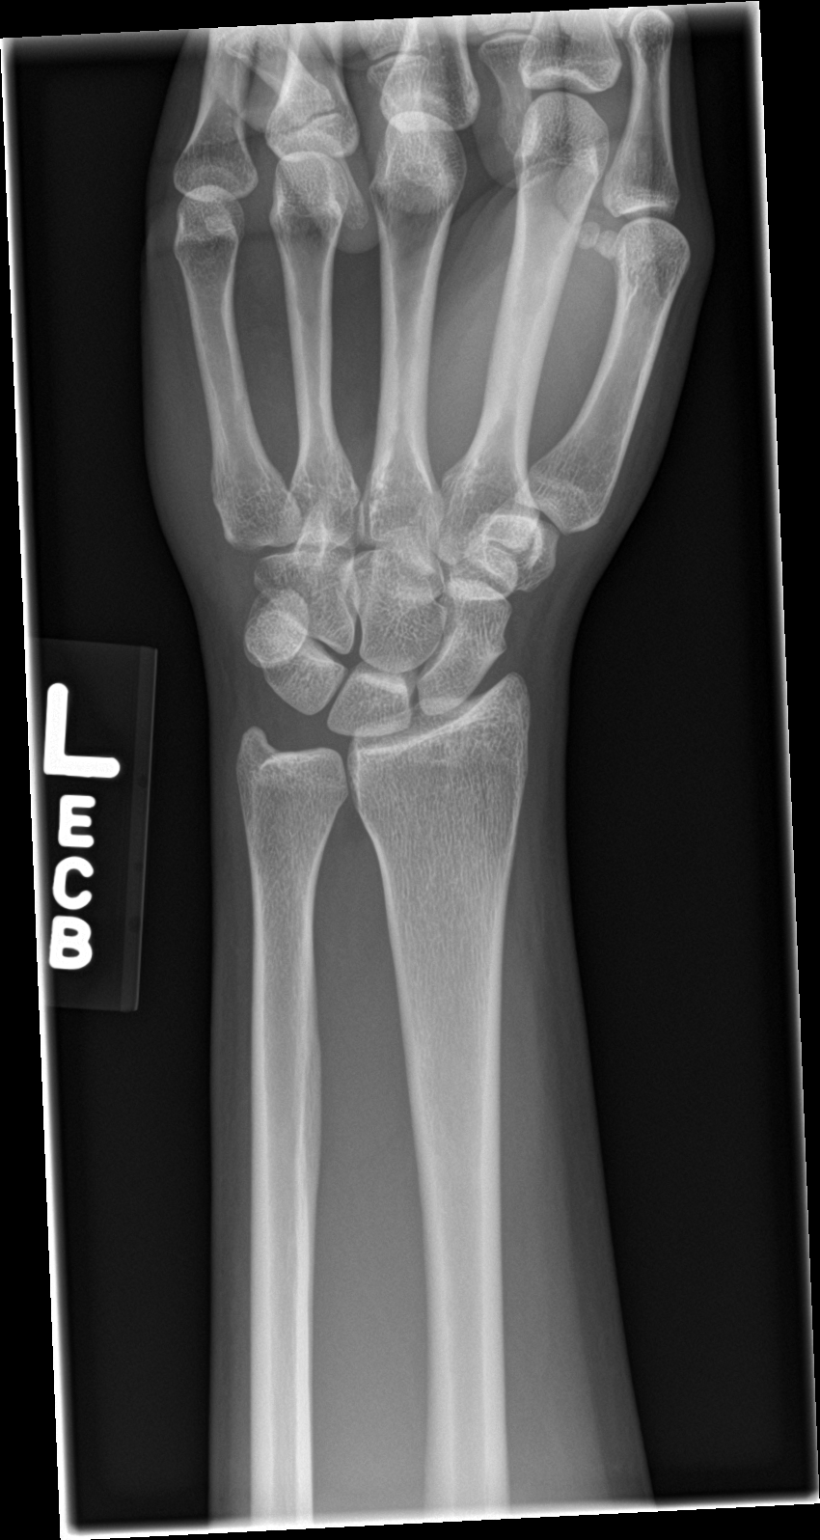

[wrist obl]
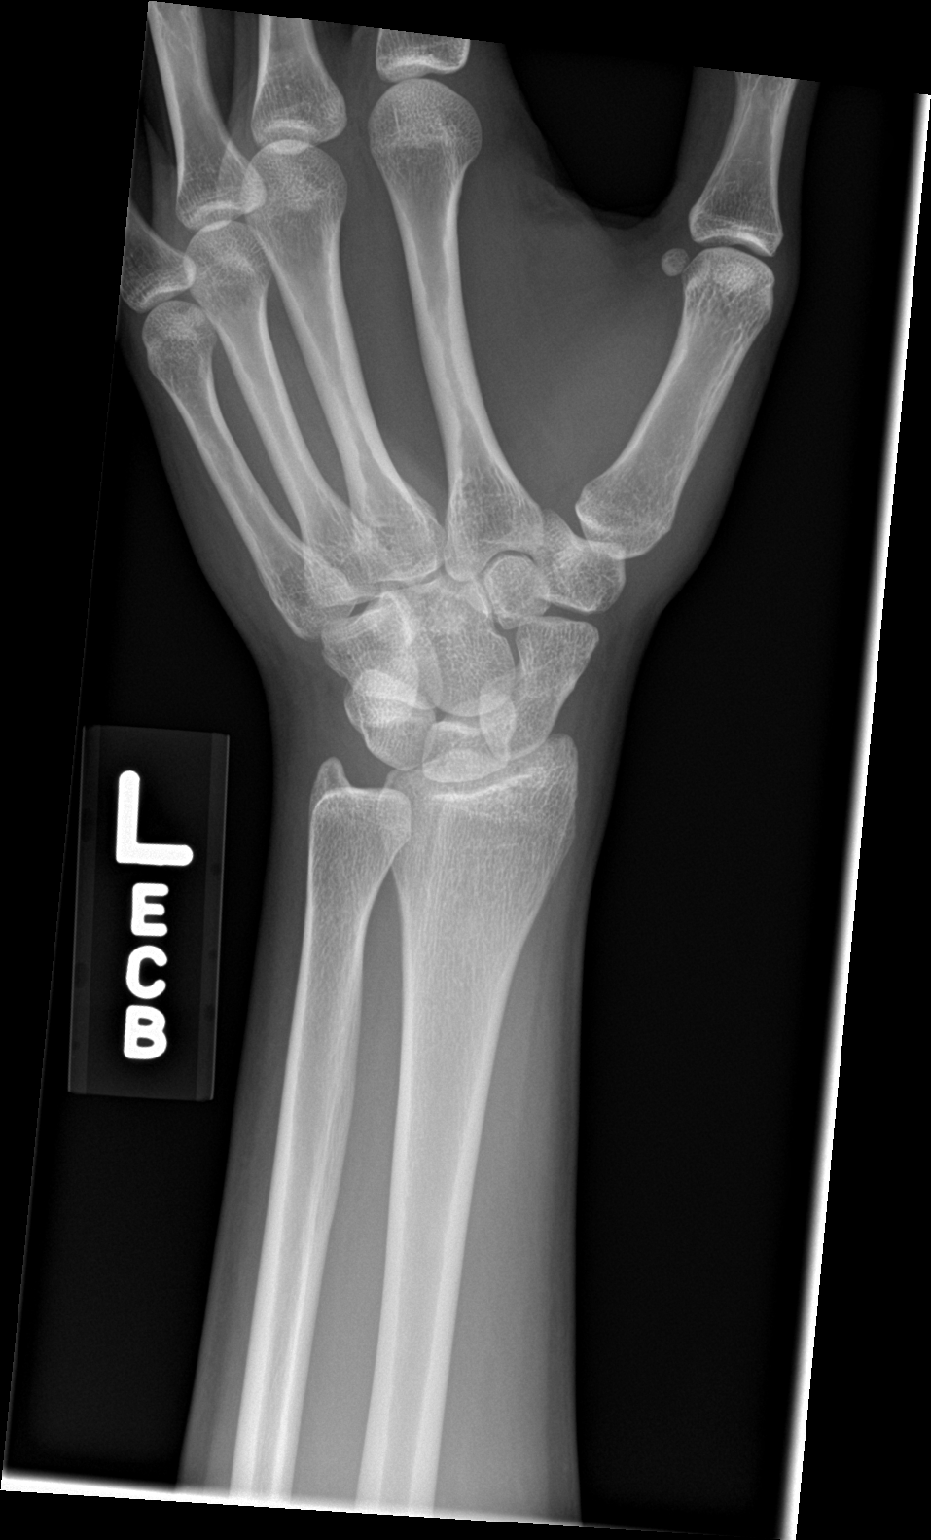

[wrist lat]
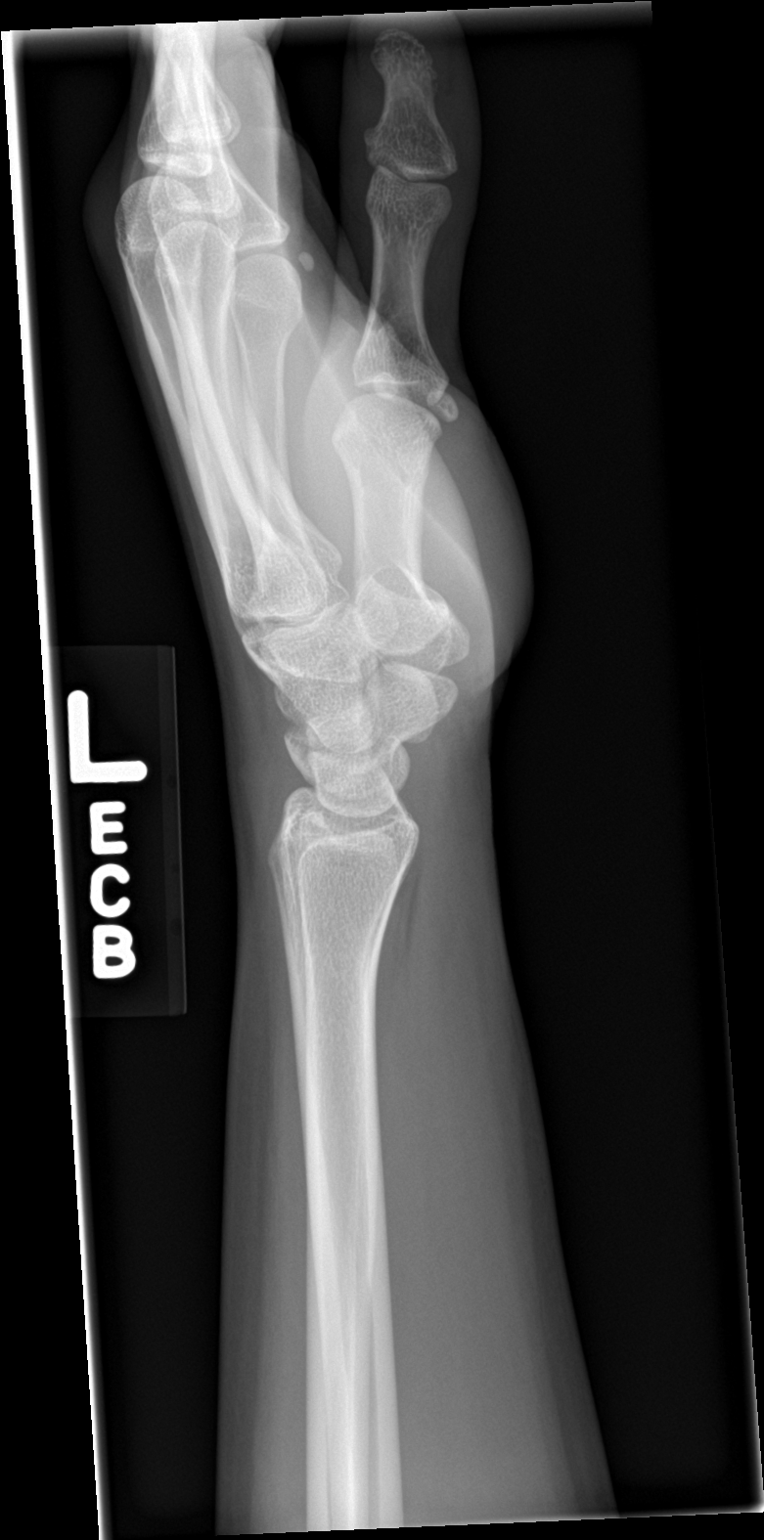

[wrist navicular]
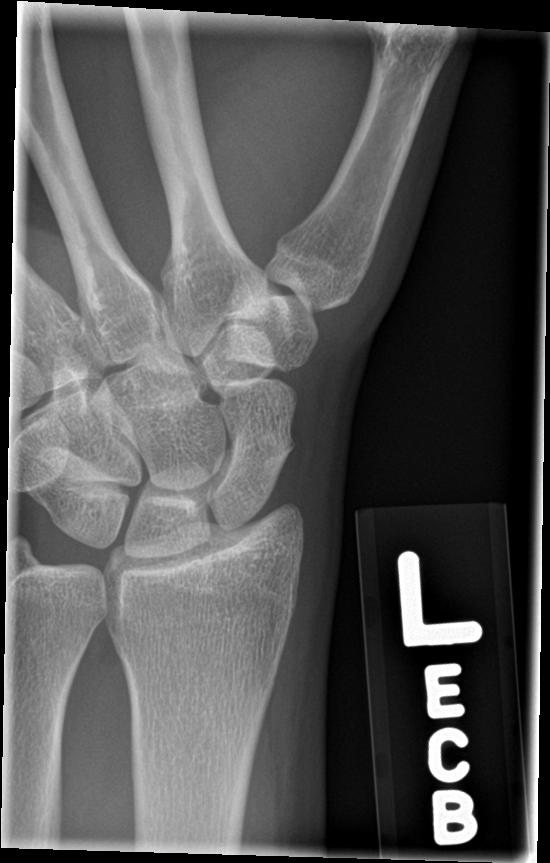

[4 of 4 positions shown; findings below may reference images not displayed]

FINDINGS: Bone mineralization is within normal limits. There is no evidence of
fracture or dislocation. There is no evidence of arthropathy or
other focal bone abnormality. Soft tissues are unremarkable.
IMPRESSION: Negative.

## 2020-09-07 ENCOUNTER — Encounter: Payer: Self-pay | Admitting: Emergency Medicine

## 2020-09-07 ENCOUNTER — Ambulatory Visit (INDEPENDENT_AMBULATORY_CARE_PROVIDER_SITE_OTHER): Payer: Self-pay

## 2020-09-07 ENCOUNTER — Other Ambulatory Visit: Payer: Self-pay

## 2020-09-07 ENCOUNTER — Ambulatory Visit
Admission: EM | Admit: 2020-09-07 | Discharge: 2020-09-07 | Disposition: A | Discharge status: Home / Self Care | Payer: Medicaid Other | Attending: Emergency Medicine | Admitting: Emergency Medicine

## 2020-09-07 DIAGNOSIS — S43401A Unspecified sprain of right shoulder joint, initial encounter: Secondary | ICD-10-CM

## 2020-09-07 DIAGNOSIS — M25511 Pain in right shoulder: Secondary | ICD-10-CM

## 2020-09-07 MED ORDER — KETOROLAC TROMETHAMINE 30 MG/ML IJ SOLN
30.0000 mg | Freq: Once | INTRAMUSCULAR | Status: AC
  Administered 2020-09-07: 30 mg via INTRAMUSCULAR

## 2020-09-07 MED ORDER — ACETAMINOPHEN 325 MG PO TABS
650.0000 mg | ORAL_TABLET | Freq: Once | ORAL | Status: AC
  Administered 2020-09-07: 650 mg via ORAL

## 2020-09-07 MED ORDER — METHYLPREDNISOLONE 4 MG PO TBPK: ORAL_TABLET | Freq: Every day | ORAL | 0 refills | Status: DC

## 2020-09-07 MED ORDER — IBUPROFEN 600 MG PO TABS: 600.0000 mg | ORAL_TABLET | Freq: Four times a day (QID) | ORAL | 0 refills | Status: DC | PRN

## 2020-09-07 MED ORDER — TIZANIDINE HCL 4 MG PO TABS: 4.0000 mg | ORAL_TABLET | Freq: Three times a day (TID) | ORAL | 0 refills | Status: DC | PRN

## 2020-09-07 NOTE — ED Provider Notes (Signed)
HPI  SUBJECTIVE:  Suzanne Hill is a right-handed 25 y.o. female who presents with constant, throbbing right shoulder pain over the entire shoulder after doing a intense routine last night.  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  She reports swelling between her spine and her shoulder last night which has resolved.  She states that the pain radiates up the back of her neck and also down her arm to the wrist.  She reports numbness and tingling in her thumb only.  No direct trauma to the shoulder.  Mild pain in the elbow, wrist, hand, but is able to use these without any problem.  She has placed herself in a sling with some improvement in her symptoms.  She has tried ice, heat, rest, 600 mg of ibuprofen alternating with 2 Tylenol every 4 hours.  The Tylenol and ibuprofen help.  Symptoms are worse with any shoulder movement, especially rotation.  She has no previous history of right shoulder injury.  LMP: 1.5 weeks ago.  Denies possibility of being pregnant.  PMD: None.   Past Medical History:  Diagnosis Date   Substance abuse [facility])     Past Surgical History:  Procedure Laterality Date   WISDOM TOOTH EXTRACTION      History reviewed. No pertinent family history.  Social History   Tobacco Use   Smoking status: Every Day   Smokeless tobacco: Never  Substance Use Topics   Alcohol use: Yes   Drug use: Yes    Types: Marijuana    No current facility-administered medications for this encounter.  Current Outpatient Medications:    ibuprofen (ADVIL) 600 MG tablet, Take 1 tablet (600 mg total) by mouth every 6 (six) hours as needed., Disp: 30 tablet, Rfl: 0   methylPREDNISolone (MEDROL DOSEPAK) 4 MG TBPK tablet, Take by mouth daily. Follow package instructions, Disp: 21 tablet, Rfl: 0   tiZANidine (ZANAFLEX) 4 MG tablet, Take 1 tablet (4 mg total) by mouth every 8 (eight) hours as needed for muscle spasms., Disp: 30 tablet, Rfl: 0  Allergies   Allergen Reactions   Benadryl [Diphenhydramine Hcl] Hives     ROS  As noted in HPI.   Physical Exam  BP 134/80 (BP Location: Right Arm)   Pulse 88   Temp 98 F (36.7 C) (Temporal)   Resp 18   SpO2 99%   Constitutional: Well developed, well nourished, moderate painful distress.  Crying Eyes:  EOMI, conjunctiva normal bilaterally HENT: Normocephalic, atraumatic,mucus membranes moist Respiratory: Normal inspiratory effort Cardiovascular: Normal rate GI: nondistended skin: No rash, skin intact Musculoskeletal: R shoulder with ROM somewhat limited.  Right trapezius tender, positive spasm.  Drop test painful but negative, clavicle NT , A/C joint mildly tender, scapula NT , proximal humerus tender, shoulder joint tender, motor strength decreased at shoulder due to pain, Sensation intact LT over deltoid region, distal NVI with hand having intact sensation and strength in the distribution of the median, radial, and ulnar nerve.  Pain  with internal rotation, pain with external rotation, Positive tenderness in bicipital groove, positive empty can test, positive liftoff test, no instability with abduction/external rotation. RP 2+  Neurologic: Alert & oriented x 3, no focal neuro deficits Psychiatric: Speech and behavior appropriate   ED Course   Medications  acetaminophen (TYLENOL) tablet 650 mg (650 mg Oral Given 09/07/20 1922)  ketorolac (TORADOL) 30 MG/ML injection 30 mg (30 mg Intramuscular Given 09/07/20 1922)    Orders Placed This Encounter  Procedures   DG Shoulder Right    Standing Status:   Standing    Number of Occurrences:   1    Order Specific Question:   Reason for Exam (SYMPTOM  OR DIAGNOSIS REQUIRED)    Answer:   pain    Order Specific Question:   Is patient pregnant?    Answer:   No    Order Specific Question:   Release to patient    Answer:   Immediate    No results found for this or any previous visit (from the past 24 hour(s)). DG Shoulder Right  Result  Date: 09/07/2020 CLINICAL DATA:  Right shoulder pain.  No injury. EXAM: RIGHT SHOULDER - 2+ VIEW COMPARISON:  None. FINDINGS: There is no evidence of fracture or dislocation. There is no evidence of arthropathy or other focal bone abnormality. Soft tissues are unremarkable. IMPRESSION: Negative. Electronically Signed   By: Burman Nieves M.D.   On: 09/07/2020 19:23    ED Clinical Impression  1. Sprain of right shoulder, unspecified shoulder sprain type, initial encounter      ED Assessment/Plan  Level Tylenol 650 mg oral Toradol 30 mg IM x1 here  Reviewed imaging independently.  No fracture, dislocation . see radiology report for full details.  Presentation consistent with a shoulder strain/sprain.  Suspect that she has strained her rotator cuff, she also has right trapezius pain tenderness and spasm.  Her x-ray is normal.  Will send home with a Medrol Dosepak, Zanaflex, Tylenol/ibuprofen, ice or heat, whichever feels better, continue sling, follow-up with [facility] or with Dr. Roda Shutters, orthopedics on-call if not better in a week to 10 days .will also provide primary care list and order assistance of finding a PMD  Discussed imaging, MDM, treatment plan, and plan for follow-up with patient. . patient agrees with plan.   Meds ordered this encounter  Medications   acetaminophen (TYLENOL) tablet 650 mg   ketorolac (TORADOL) 30 MG/ML injection 30 mg   ibuprofen (ADVIL) 600 MG tablet    Sig: Take 1 tablet (600 mg total) by mouth every 6 (six) hours as needed.    Dispense:  30 tablet    Refill:  0   methylPREDNISolone (MEDROL DOSEPAK) 4 MG TBPK tablet    Sig: Take by mouth daily. Follow package instructions    Dispense:  21 tablet    Refill:  0   tiZANidine (ZANAFLEX) 4 MG tablet    Sig: Take 1 tablet (4 mg total) by mouth every 8 (eight) hours as needed for muscle spasms.    Dispense:  30 tablet    Refill:  0      *This clinic note was created using Scientist, clinical (histocompatibility and immunogenetics).  Therefore, there may be occasional mistakes despite careful proofreading.  ?    Domenick Gong, MD 09/08/20 [id number]

## 2020-09-07 NOTE — Discharge Instructions (Addendum)
Finish the Medrol Dosepak, Zanaflex for muscle spasms, ice or heat, whichever feels better, 600 mg of ibuprofen combined with 1000 mg of Tylenol together 3-4 times a day as needed for pain.  Sleep with a pillow between your body and your arm to help decompress your shoulder.

## 2020-09-07 NOTE — ED Triage Notes (Addendum)
RT shoulder pain x 2 days. States she may have hit it on a stick but no falls. Used tylenol, ibuprofen, ice and heat with minimal relief.

## 2021-02-11 ENCOUNTER — Encounter (HOSPITAL_COMMUNITY): Payer: Self-pay

## 2021-02-11 ENCOUNTER — Emergency Department (HOSPITAL_COMMUNITY)
Admission: EM | Admit: 2021-02-11 | Discharge: 2021-02-11 | Payer: Medicaid Other | Attending: Student | Admitting: Student

## 2021-02-11 ENCOUNTER — Other Ambulatory Visit: Payer: Self-pay

## 2021-02-11 DIAGNOSIS — R000 Tachycardia, unspecified: Secondary | ICD-10-CM | POA: Insufficient documentation

## 2021-02-11 DIAGNOSIS — F17200 Nicotine dependence, unspecified, uncomplicated: Secondary | ICD-10-CM | POA: Insufficient documentation

## 2021-02-11 DIAGNOSIS — R400 Somnolence: Secondary | ICD-10-CM | POA: Insufficient documentation

## 2021-02-11 DIAGNOSIS — T401X1A Poisoning by heroin, accidental (unintentional), initial encounter: Secondary | ICD-10-CM | POA: Insufficient documentation

## 2021-02-11 LAB — CBG MONITORING, ED: Glucose-Capillary: 144 mg/dL — ABNORMAL HIGH (ref 70–99)

## 2021-02-11 NOTE — ED Triage Notes (Signed)
Pt brought into ED via [facility] from [facility]. Pt went into jail this morning, was there approximately 5 hours, when jailer made rounds, found her unresponsive in her cell. Nurse in jail gave her Narcan 20mg  IN. Pt now alert and oriented. Pt states she took heroin and maybe some fentanyl before she went to jail this morning. [facility] remains at bedside.

## 2021-02-11 NOTE — ED Provider Notes (Signed)
[facility] EMERGENCY DEPARTMENT Provider Note  CSN: [id number] Arrival date & time: 02/11/21 1532  Chief Complaint(s) Drug Overdose  HPI Suzanne Hill is a 26 y.o. female with PMH heroin abuse with previous overdose presentations who presents emergency department for evaluation of a heroin overdose.  Patient was found in her jail cell today with an opioid overdose.  Staff in the jail administered 20 mg total of intranasal Narcan.  Patient arrives alert and oriented answering all questions appropriately with no complaints of shortness of breath, nausea, vomiting, pain or other systemic symptoms.  She admits to heroin use prior to being arrested.   Drug Overdose   Past Medical History Past Medical History:  Diagnosis Date   Substance abuse [facility])    Patient Active Problem List   Diagnosis Date Noted   Acute respiratory failure (HCC) 04/25/2016   Accidental overdose of heroin (HCC)    Septic shock (HCC)    Acute pulmonary edema (HCC)    Chlamydia 05/08/2013   Abdominal pain 05/08/2013   Home Medication(s) Prior to Admission medications   Medication Sig Start Date End Date Taking? Authorizing Provider  ibuprofen (ADVIL) 600 MG tablet Take 1 tablet (600 mg total) by mouth every 6 (six) hours as needed. 09/07/20   Domenick Gong, MD  methylPREDNISolone (MEDROL DOSEPAK) 4 MG TBPK tablet Take by mouth daily. Follow package instructions 09/07/20   Domenick Gong, MD  tiZANidine (ZANAFLEX) 4 MG tablet Take 1 tablet (4 mg total) by mouth every 8 (eight) hours as needed for muscle spasms. 09/07/20   Domenick Gong, MD                                                                                                                                    Past Surgical History Past Surgical History:  Procedure Laterality Date   WISDOM TOOTH EXTRACTION     Family History No family history on file.  Social History Social History   Tobacco Use   Smoking status: Every Day   Smokeless  tobacco: Never  Substance Use Topics   Alcohol use: Yes   Drug use: Yes    Types: Marijuana    Comment: Heroin   Allergies Benadryl [diphenhydramine hcl]  Review of Systems Review of Systems  Neurological:        Somnolence   Physical Exam Vital Signs  I have reviewed the triage vital signs BP (!) 136/103    Pulse (!) 103    Temp (!) 97.3 F (36.3 C) (Oral)    Resp 14    Ht 5\' 5"  (1.651 m)    Wt 53.5 kg    SpO2 100%    BMI 19.64 kg/m   Physical Exam Vitals and nursing note reviewed.  Constitutional:      General: She is not in acute distress.    Appearance: She is well-developed.  HENT:     Head: Normocephalic and atraumatic.  Eyes:  Conjunctiva/sclera: Conjunctivae normal.  Cardiovascular:     Rate and Rhythm: Normal rate and regular rhythm.     Heart sounds: No murmur heard. Pulmonary:     Effort: Pulmonary effort is normal. No respiratory distress.     Breath sounds: Normal breath sounds.  Abdominal:     Palpations: Abdomen is soft.     Tenderness: There is no abdominal tenderness.  Musculoskeletal:        General: No swelling.     Cervical back: Neck supple.  Skin:    General: Skin is warm and dry.     Capillary Refill: Capillary refill takes less than 2 seconds.  Neurological:     Mental Status: She is alert.  Psychiatric:        Mood and Affect: Mood normal.    ED Results and Treatments Labs (all labs ordered are listed, but only abnormal results are displayed) Labs Reviewed  CBG MONITORING, ED - Abnormal; Notable for the following components:      Result Value   Glucose-Capillary 144 (*)    All other components within normal limits                                                                                                                          Radiology No results found.  Pertinent labs & imaging results that were available during my care of the patient were reviewed by me and considered in my medical decision making (see MDM for  details).  Medications Ordered in ED Medications - No data to display                                                                                                                                   Procedures Procedures  (including critical care time)  Medical Decision Making / ED Course   This patient presents to the ED for concern of opioid overdose, this involves an extensive number of treatment options, and is a complaint that carries with it a high risk of complications and morbidity.  The differential diagnosis includes opioid overdose, coingestion, syncope  MDM: Patient seen in the emergency department for evaluation of an opioid overdose.  Physical exam is unremarkable with an unremarkable cardiopulmonary exam.  An unremarkable neurologic exam.  Initial glucose 144.  Patient mildly tachycardic likely secondary to acute opioid reversal.  She is alert and oriented answering all questions appropriately and  able to tolerate p.o. without difficulty.  I spoke with jail staff who states that they have not exhausted their stores of Narcan and can easily administer this back in prison if needed.  On reevaluation, patient remains alert and oriented with no evidence of persistent overdose.  Patient denies coingestions.  Patient safe for discharge at this time and she was discharged back to jail.   Additional history obtained: -Additional history obtained from jail staff -External records from outside source obtained and reviewed including: Chart review including previous notes, labs, imaging, consultation notes   Lab Tests: -I ordered, reviewed, and interpreted labs.   The pertinent results include:   Labs Reviewed  CBG MONITORING, ED - Abnormal; Notable for the following components:      Result Value   Glucose-Capillary 144 (*)    All other components within normal limits       Medicines ordered and prescription drug management: No orders of the defined types were placed in this  encounter.   -I have reviewed the patients home medicines and have made adjustments as needed  Critical interventions none  Cardiac Monitoring: The patient was maintained on a cardiac monitor.  I personally viewed and interpreted the cardiac monitored which showed an underlying rhythm of: Sinus tachycardia  Social Determinants of Health:  Factors impacting patients care include: Polysubstance abuse, currently incarcerated   Reevaluation: After the interventions noted above, I reevaluated the patient and found that they have :improved  Co morbidities that complicate the patient evaluation  Past Medical History:  Diagnosis Date   Substance abuse (HCC)       Dispostion: I considered admission for this patient, but she is alert and oriented answering all questions properly and successfully reversed with Narcan.  Low suspicion for long-acting opioids.  Safe for discharge back to jail.     Final Clinical Impression(s) / ED Diagnoses Final diagnoses:  None     @PCDICTATION @    , MD 02/11/21 1554

## 2022-06-18 ENCOUNTER — Ambulatory Visit
Admission: EM | Admit: 2022-06-18 | Discharge: 2022-06-18 | Disposition: A | Discharge status: Home / Self Care | Payer: Medicaid Other

## 2022-06-18 DIAGNOSIS — Z3201 Encounter for pregnancy test, result positive: Secondary | ICD-10-CM | POA: Diagnosis not present

## 2022-06-18 LAB — POCT URINE PREGNANCY: Preg Test, Ur: POSITIVE — AB

## 2022-06-18 NOTE — Discharge Instructions (Addendum)
Pregnancy test today is positive.  Start taking a prenatal vitamin.  Your estimated delivery date is 12/29/2022.  Follow-up with OB/GYN for prenatal care.

## 2022-06-18 NOTE — ED Provider Notes (Signed)
[facility]    CSN: [id number] Arrival date & time: 06/18/22  1507      History   Chief Complaint Chief Complaint  Patient presents with   Possible Pregnancy    HPI Suzanne Hill is a 27 y.o. female.   Patient presents today for positive pregnancy test.  She is requesting copy of test today for insurance purposes and week.  Reports last menstrual period 03/25/2022, she reports up-and-down emotions, morning sickness, and fatigue.  Denies vaginal bleeding, abdominal pain or cramping, breast pain.  Does not currently have OB/GYN.  Is not currently taking prenatal vitamin.  Takes Seroquel daily at bedtime for mood disorder.    Past Medical History:  Diagnosis Date   Substance abuse [facility])     Patient Active Problem List   Diagnosis Date Noted   Acute respiratory failure (HCC) 04/25/2016   Accidental overdose of heroin (HCC)    Septic shock (HCC)    Acute pulmonary edema (HCC)    Chlamydia 05/08/2013   Abdominal pain 05/08/2013    Past Surgical History:  Procedure Laterality Date   WISDOM TOOTH EXTRACTION      OB History   No obstetric history on file.      Home Medications    Prior to Admission medications   Medication Sig Start Date End Date Taking? Authorizing Provider  QUEtiapine (SEROQUEL) 100 MG tablet Take 200 mg by mouth at bedtime.    [provider]    Family History History reviewed. No pertinent family history.  Social History Social History   Tobacco Use   Smoking status: Every Day   Smokeless tobacco: Never  Substance Use Topics   Alcohol use: Yes   Drug use: Yes    Types: Marijuana    Comment: Heroin     Allergies   Benadryl [diphenhydramine hcl]   Review of Systems Review of Systems Per HPI  Physical Exam Triage Vital Signs ED Triage Vitals  Enc Vitals Group     BP 06/18/22 1517 132/84     Pulse Rate 06/18/22 1517 (!) 109     Resp 06/18/22 1517 18     Temp 06/18/22 1517 (!) 97.2 F (36.2 C)     Temp  Source 06/18/22 1517 Oral     SpO2 06/18/22 1517 97 %     Weight --      Height --      Head Circumference --      Peak Flow --      Pain Score 06/18/22 1515 0     Pain Loc --      Pain Edu? --      Excl. in GC? --    No data found.  Updated Vital Signs BP 132/84 (BP Location: Right Arm)   Pulse (!) 109   Temp (!) 97.2 F (36.2 C) (Oral)   Resp 18   LMP 03/25/2022   SpO2 97%   Visual Acuity Right Eye Distance:   Left Eye Distance:   Bilateral Distance:    Right Eye Near:   Left Eye Near:    Bilateral Near:     Physical Exam Vitals and nursing note reviewed.  Constitutional:      General: She is not in acute distress.    Appearance: Normal appearance. She is not toxic-appearing.  HENT:     Head: Normocephalic and atraumatic.     Mouth/Throat:     Mouth: Mucous membranes are moist.     Pharynx: Oropharynx is clear.  Pulmonary:     Effort: Pulmonary effort is normal. No respiratory distress.  Skin:    Coloration: Skin is not jaundiced or pale.     Findings: No erythema.  Neurological:     Mental Status: She is alert and oriented to person, place, and time.  Psychiatric:        Behavior: Behavior is cooperative.      UC Treatments / Results  Labs (all labs ordered are listed, but only abnormal results are displayed) Labs Reviewed  POCT URINE PREGNANCY - Abnormal; Notable for the following components:      Result Value   Preg Test, Ur Positive (*)    All other components within normal limits    EKG   Radiology No results found.  Procedures Procedures (including critical care time)  Medications Ordered in UC Medications - No data to display  Initial Impression / Assessment and Plan / UC Course  I have reviewed the triage vital signs and the nursing notes.  Pertinent labs & imaging results that were available during my care of the patient were reviewed by me and considered in my medical decision making (see chart for details).   Patient is  well-appearing, normotensive, afebrile, not tachycardic, not tachypneic, oxygenating well on room air.    1. Positive pregnancy test Start prenatal vitamin Discussed estimated delivery date Follow-up with OB/GYN and contact info given Also gave copy of positive pregnancy test  The patient was given the opportunity to ask questions.  All questions answered to their satisfaction.  The patient is in agreement to this plan.    Final Clinical Impressions(s) / UC Diagnoses   Final diagnoses:  Positive pregnancy test     Discharge Instructions      Pregnancy test today is positive.  Start taking a prenatal vitamin.  Your estimated delivery date is 12/29/2022.  Follow-up with OB/GYN for prenatal care.    ED Prescriptions   None    PDMP not reviewed this encounter.   Valentino Nose, NP 06/18/22 1537

## 2022-06-18 NOTE — ED Triage Notes (Signed)
Patient needing pregnancy test. LMP: 03/25/22 patient states at home test was positive.

## 2022-07-16 ENCOUNTER — Other Ambulatory Visit: Payer: Self-pay

## 2022-07-16 ENCOUNTER — Encounter: Payer: Self-pay | Admitting: Emergency Medicine

## 2022-07-16 ENCOUNTER — Ambulatory Visit
Admission: EM | Admit: 2022-07-16 | Discharge: 2022-07-16 | Disposition: A | Discharge status: Home / Self Care | Payer: Medicaid Other | Attending: Family Medicine | Admitting: Family Medicine

## 2022-07-16 DIAGNOSIS — S61215A Laceration without foreign body of left ring finger without damage to nail, initial encounter: Secondary | ICD-10-CM | POA: Diagnosis not present

## 2022-07-16 NOTE — ED Triage Notes (Signed)
Pt reports left ring finger laceration while slicing bacon around noon today. Pt reports pain is radiating to left forearm. Pt able to move all digits and make a fist.   Last tetanus April 2023.

## 2022-07-16 NOTE — ED Notes (Signed)
Site soaking in Hibiclens and sterile water mixture. Pt tolerating well. 

## 2022-07-19 DIAGNOSIS — S61215A Laceration without foreign body of left ring finger without damage to nail, initial encounter: Secondary | ICD-10-CM | POA: Diagnosis not present

## 2022-07-19 NOTE — ED Provider Notes (Signed)
[facility]    CSN: [id number] Arrival date & time: 07/16/22  1519      History   Chief Complaint Chief Complaint  Patient presents with   Laceration    HPI Suzanne Hill is a 27 y.o. female.   Patient presenting today with a laceration to her left ring finger that occurred earlier while slicing bacon.  She states she has wash the area off and has been applying pressure with good control of bleeding.  Denies decreased range of motion, loss of sensation, uncontrolled bleeding, discoloration of the finger.  Last tetanus per patient was April 2023.  Of note, patient is currently pregnant.    Past Medical History:  Diagnosis Date   Substance abuse [facility] [place])     Patient Active Problem List   Diagnosis Date Noted   Acute respiratory failure (HCC) 04/25/2016   Accidental overdose of heroin (HCC)    Septic shock (HCC)    Acute pulmonary edema (HCC)    Chlamydia 05/08/2013   Abdominal pain 05/08/2013    Past Surgical History:  Procedure Laterality Date   WISDOM TOOTH EXTRACTION      OB History     Gravida  1   Para      Term      Preterm      AB      Living         SAB      IAB      Ectopic      Multiple      Live Births               Home Medications    Prior to Admission medications   Medication Sig Start Date End Date Taking? Authorizing Provider  QUEtiapine (SEROQUEL) 100 MG tablet Take 200 mg by mouth at bedtime.    [provider]    Family History History reviewed. No pertinent family history.  Social History Social History   Tobacco Use   Smoking status: Every Day    Packs/day: .5    Types: Cigarettes   Smokeless tobacco: Never  Substance Use Topics   Alcohol use: Yes   Drug use: Yes    Types: Marijuana    Comment: Heroin; attempted to quit.     Allergies   Benadryl [diphenhydramine hcl]   Review of Systems Review of Systems Per HPI  Physical Exam Triage Vital Signs ED Triage Vitals  Enc  Vitals Group     BP 07/16/22 1535 134/82     Pulse Rate 07/16/22 1535 (!) 115     Resp 07/16/22 1535 20     Temp 07/16/22 1535 98.3 F (36.8 C)     Temp Source 07/16/22 1535 Oral     SpO2 07/16/22 1535 98 %     Weight --      Height --      Head Circumference --      Peak Flow --      Pain Score 07/16/22 1533 8     Pain Loc --      Pain Edu? --      Excl. in GC? --    No data found.  Updated Vital Signs BP 134/82 (BP Location: Right Arm)   Pulse (!) 115   Temp 98.3 F (36.8 C) (Oral)   Resp 20   LMP 03/25/2022   SpO2 98%   Visual Acuity Right Eye Distance:   Left Eye Distance:   Bilateral Distance:  Right Eye Near:   Left Eye Near:    Bilateral Near:     Physical Exam Vitals and nursing note reviewed.  Constitutional:      Appearance: Normal appearance. She is not ill-appearing.  HENT:     Head: Atraumatic.  Eyes:     Extraocular Movements: Extraocular movements intact.     Conjunctiva/sclera: Conjunctivae normal.  Cardiovascular:     Rate and Rhythm: Normal rate and regular rhythm.     Heart sounds: Normal heart sounds.  Pulmonary:     Effort: Pulmonary effort is normal.     Breath sounds: Normal breath sounds.  Musculoskeletal:        General: Tenderness and signs of injury present. No swelling. Normal range of motion.     Cervical back: Normal range of motion and neck supple.  Skin:    General: Skin is warm.     Comments: Linear well-approximated laceration to the left ring finger, bleeding well-controlled, no foreign body appreciable  Neurological:     Mental Status: She is alert and oriented to person, place, and time.     Comments: Left hand neurovascularly intact  Psychiatric:        Mood and Affect: Mood normal.        Thought Content: Thought content normal.        Judgment: Judgment normal.      UC Treatments / Results  Labs (all labs ordered are listed, but only abnormal results are displayed) Labs Reviewed - No data to  display  EKG   Radiology No results found.  Procedures Laceration Repair  Date/Time: 07/19/2022 2:14 PM  Performed by: Particia Nearing, PA-C Authorized by: Particia Nearing, PA-C   Consent:    Consent obtained:  Verbal   Consent given by:  Patient   Risks, benefits, and alternatives were discussed: yes     Risks discussed:  Infection, pain and poor wound healing   Alternatives discussed:  Observation Universal protocol:    Procedure explained and questions answered to patient or proxy's satisfaction: yes     Relevant documents present and verified: yes     Patient identity confirmed:  Verbally with patient and arm band Anesthesia:    Anesthesia method:  None Laceration details:    Location:  Finger   Finger location:  L ring finger   Length (cm):  1.5   Depth (mm):  3 Pre-procedure details:    Preparation:  Patient was prepped and draped in usual sterile fashion Exploration:    Limited defect created (wound extended): no     Hemostasis achieved with:  Direct pressure   Wound exploration: wound explored through full range of motion     Contaminated: no   Treatment:    Area cleansed with:  Chlorhexidine   Amount of cleaning:  Standard   Irrigation solution:  Sterile saline   Irrigation method:  Pressure wash   Visualized foreign bodies/material removed: no   Skin repair:    Repair method:  Tissue adhesive Approximation:    Approximation:  Close Repair type:    Repair type:  Simple Post-procedure details:    Dressing:  Non-adherent dressing and splint for protection   Procedure completion:  Tolerated well, no immediate complications  (including critical care time)  Medications Ordered in UC Medications - No data to display  Initial Impression / Assessment and Plan / UC Course  I have reviewed the triage vital signs and the nursing notes.  Pertinent labs & imaging  results that were available during my care of the patient were reviewed by me and  considered in my medical decision making (see chart for details).     Wound closed with Dermabond after extensive cleaning, splint placed for protection.  Discussed home wound care and return precautions.  Up-to-date on tetanus.  Discussed Tylenol for pain as unable to take NSAIDs due to pregnancy. Final Clinical Impressions(s) / UC Diagnoses   Final diagnoses:  Laceration of left ring finger without foreign body without damage to nail, initial encounter   Discharge Instructions   None    ED Prescriptions   None    PDMP not reviewed this encounter.   Particia Nearing, [place] 07/19/22 1415

## 2022-10-07 ENCOUNTER — Ambulatory Visit
Admission: EM | Admit: 2022-10-07 | Discharge: 2022-10-07 | Disposition: A | Discharge status: Home / Self Care | Payer: Medicaid Other

## 2022-10-07 ENCOUNTER — Encounter: Payer: Self-pay | Admitting: Emergency Medicine

## 2022-10-07 ENCOUNTER — Other Ambulatory Visit: Payer: Self-pay

## 2022-10-07 DIAGNOSIS — L02412 Cutaneous abscess of left axilla: Secondary | ICD-10-CM | POA: Diagnosis not present

## 2022-10-07 MED ORDER — MUPIROCIN 2 % EX OINT: 1.0000 | TOPICAL_OINTMENT | Freq: Two times a day (BID) | CUTANEOUS | 0 refills | Status: AC

## 2022-10-07 MED ORDER — CHLORHEXIDINE GLUCONATE 4 % EX SOLN: Freq: Every day | CUTANEOUS | 0 refills | Status: AC | PRN

## 2022-10-07 MED ORDER — CEPHALEXIN 500 MG PO CAPS: 500.0000 mg | ORAL_CAPSULE | Freq: Two times a day (BID) | ORAL | 0 refills | Status: AC

## 2022-10-07 NOTE — ED Provider Notes (Signed)
[facility]    CSN: [id number] Arrival date & time: 10/07/22  1042      History   Chief Complaint Chief Complaint  Patient presents with   Skin Problem    HPI Suzanne Hill is a 27 y.o. female.   Patient presenting today with 5-day history of a painful red bump to the left axilla that has increased in size and pain level for the last few days.  Denies active drainage, fevers, nausea, vomiting, chills.  States area showed up after shaving.  She did use a new razor.  So far trying warm compresses and ice with no relief.  Is about 6 months pregnant.    Past Medical History:  Diagnosis Date   Substance abuse [facility])     Patient Active Problem List   Diagnosis Date Noted   Acute respiratory failure (HCC) 04/25/2016   Accidental overdose of heroin (HCC)    Septic shock (HCC)    Acute pulmonary edema (HCC)    Chlamydia 05/08/2013   Abdominal pain 05/08/2013    Past Surgical History:  Procedure Laterality Date   WISDOM TOOTH EXTRACTION      OB History     Gravida  1   Para      Term      Preterm      AB      Living         SAB      IAB      Ectopic      Multiple      Live Births               Home Medications    Prior to Admission medications   Medication Sig Start Date End Date Taking? Authorizing Provider  cephALEXin (KEFLEX) 500 MG capsule Take 1 capsule (500 mg total) by mouth 2 (two) times daily. 10/07/22  Yes Particia Nearing, PA-C  chlorhexidine (HIBICLENS) 4 % external liquid Apply topically daily as needed. 10/07/22  Yes Particia Nearing, PA-C  mupirocin ointment (BACTROBAN) 2 % Apply 1 Application topically 2 (two) times daily. 10/07/22  Yes Particia Nearing, PA-C  Prenatal Vit-Fe Fumarate-FA (PRENATAL VITAMINS PO) Take by mouth.   Yes [provider]  QUEtiapine (SEROQUEL) 100 MG tablet Take 200 mg by mouth at bedtime.    [provider]    Family History History reviewed. No pertinent  family history.  Social History Social History   Tobacco Use   Smoking status: Every Day    Current packs/day: 0.50    Types: Cigarettes   Smokeless tobacco: Never  Substance Use Topics   Alcohol use: Yes   Drug use: Yes    Types: Marijuana    Comment: Heroin; attempted to quit. Occ     Allergies   Benadryl [diphenhydramine hcl]   Review of Systems Review of Systems Per HPI  Physical Exam Triage Vital Signs ED Triage Vitals  Encounter Vitals Group     BP 10/07/22 1143 115/80     Systolic BP Percentile --      Diastolic BP Percentile --      Pulse Rate 10/07/22 1143 80     Resp 10/07/22 1143 20     Temp 10/07/22 1143 97.9 F (36.6 C)     Temp Source 10/07/22 1143 Oral     SpO2 10/07/22 1143 98 %     Weight --      Height --      Head Circumference --  Peak Flow --      Pain Score 10/07/22 1141 7     Pain Loc --      Pain Education --      Exclude from Growth Chart --    No data found.  Updated Vital Signs BP 115/80 (BP Location: Right Arm)   Pulse 80   Temp 97.9 F (36.6 C) (Oral)   Resp 20   LMP 03/25/2022   SpO2 98%   Visual Acuity Right Eye Distance:   Left Eye Distance:   Bilateral Distance:    Right Eye Near:   Left Eye Near:    Bilateral Near:     Physical Exam Vitals and nursing note reviewed.  Constitutional:      Appearance: Normal appearance. She is not ill-appearing.  HENT:     Head: Atraumatic.  Eyes:     Extraocular Movements: Extraocular movements intact.     Conjunctiva/sclera: Conjunctivae normal.  Cardiovascular:     Rate and Rhythm: Normal rate and regular rhythm.     Heart sounds: Normal heart sounds.  Pulmonary:     Effort: Pulmonary effort is normal.     Breath sounds: Normal breath sounds.  Musculoskeletal:        General: Normal range of motion.     Cervical back: Normal range of motion and neck supple.  Skin:    General: Skin is warm and dry.     Comments: 1.5 cm erythematous abscess to the left axilla,  fluctuant, no active drainage  Neurological:     Mental Status: She is alert and oriented to person, place, and time.  Psychiatric:        Mood and Affect: Mood normal.        Thought Content: Thought content normal.        Judgment: Judgment normal.      UC Treatments / Results  Labs (all labs ordered are listed, but only abnormal results are displayed) Labs Reviewed - No data to display  EKG   Radiology No results found.  Procedures Incision and Drainage  Date/Time: 10/07/2022 12:20 PM  Performed by: Particia Nearing, PA-C Authorized by: Particia Nearing, PA-C   Consent:    Consent obtained:  Verbal   Consent given by:  Patient   Risks, benefits, and alternatives were discussed: yes     Risks discussed:  Bleeding, incomplete drainage and infection   Alternatives discussed:  Alternative treatment Universal protocol:    Procedure explained and questions answered to patient or proxy's satisfaction: yes     Relevant documents present and verified: yes     Patient identity confirmed:  Verbally with patient and arm band Location:    Type:  Abscess   Size:  1 cm   Location: Left axilla. Pre-procedure details:    Skin preparation:  Chlorhexidine with alcohol Sedation:    Sedation type:  None Anesthesia:    Anesthesia method:  Local infiltration   Local anesthetic:  Lidocaine 1% w/o epi Procedure type:    Complexity:  Simple Procedure details:    Ultrasound guidance: no     Needle aspiration: no     Incision types:  Stab incision   Incision depth:  Dermal   Wound management:  Probed and deloculated   Drainage:  Purulent   Drainage amount:  Moderate   Wound treatment:  Wound left open   Packing materials:  None Post-procedure details:    Procedure completion:  Tolerated well, no immediate complications  (including  critical care time)  Medications Ordered in UC Medications - No data to display  Initial Impression / Assessment and Plan / UC Course   I have reviewed the triage vital signs and the nursing notes.  Pertinent labs & imaging results that were available during my care of the patient were reviewed by me and considered in my medical decision making (see chart for details).     I&D performed with no obvious immediate complications, dressing applied and will treat with Keflex, Hibiclens, mupirocin and warm compresses at home.  Follow-up with PCP for a recheck next week.  Return sooner for worsening symptoms.  Final Clinical Impressions(s) / UC Diagnoses   Final diagnoses:  Abscess of left axilla   Discharge Instructions   None    ED Prescriptions     Medication Sig Dispense Auth. Provider   cephALEXin (KEFLEX) 500 MG capsule Take 1 capsule (500 mg total) by mouth 2 (two) times daily. 14 capsule Particia Nearing, [place]   mupirocin ointment (BACTROBAN) 2 % Apply 1 Application topically 2 (two) times daily. 22 g Particia Nearing, [place]   chlorhexidine (HIBICLENS) 4 % external liquid Apply topically daily as needed. 236 mL Particia Nearing, [place]      PDMP not reviewed this encounter.   Particia Nearing, [place] 10/07/22 1221

## 2022-10-07 NOTE — ED Triage Notes (Signed)
Pt reports possible abscess to left axilla x 4-5 days ago. Pt reports increase in redness and swelling for last several days. Denies any known fevers, chills, nausea.  Pt reports is approximately 6 months pregnant.
